# Patient Record
Sex: Male | Born: 1975 | Race: Black or African American | Hispanic: No | Marital: Single | Smoking: Current every day smoker
Health system: Southern US, Community
[De-identification: ages and names within clinical notes are randomized; demographics above are authoritative.]

## PROBLEM LIST (undated history)

## (undated) DIAGNOSIS — N419 Inflammatory disease of prostate, unspecified: Secondary | ICD-10-CM

## (undated) DIAGNOSIS — R7303 Prediabetes: Secondary | ICD-10-CM

## (undated) DIAGNOSIS — R200 Anesthesia of skin: Secondary | ICD-10-CM

## (undated) DIAGNOSIS — H547 Unspecified visual loss: Secondary | ICD-10-CM

## (undated) DIAGNOSIS — K219 Gastro-esophageal reflux disease without esophagitis: Secondary | ICD-10-CM

## (undated) HISTORY — DX: Gastro-esophageal reflux disease without esophagitis: K21.9

## (undated) HISTORY — DX: Inflammatory disease of prostate, unspecified: N41.9

## (undated) HISTORY — DX: Unspecified visual loss: H54.7

## (undated) HISTORY — DX: Anesthesia of skin: R20.0

---

## 2004-01-06 ENCOUNTER — Emergency Department (HOSPITAL_COMMUNITY): Admission: EM | Admit: 2004-01-06 | Discharge: 2004-01-06 | Payer: Self-pay | Admitting: Emergency Medicine

## 2015-02-04 ENCOUNTER — Emergency Department (HOSPITAL_COMMUNITY)
Admission: EM | Admit: 2015-02-04 | Discharge: 2015-02-04 | Disposition: A | Discharge status: Home / Self Care | Payer: Self-pay | Attending: Emergency Medicine | Admitting: Emergency Medicine

## 2015-02-04 ENCOUNTER — Encounter (HOSPITAL_COMMUNITY): Payer: Self-pay | Admitting: Cardiology

## 2015-02-04 DIAGNOSIS — H109 Unspecified conjunctivitis: Secondary | ICD-10-CM | POA: Insufficient documentation

## 2015-02-04 DIAGNOSIS — M79643 Pain in unspecified hand: Secondary | ICD-10-CM | POA: Insufficient documentation

## 2015-02-04 DIAGNOSIS — N390 Urinary tract infection, site not specified: Secondary | ICD-10-CM | POA: Insufficient documentation

## 2015-02-04 DIAGNOSIS — F17200 Nicotine dependence, unspecified, uncomplicated: Secondary | ICD-10-CM | POA: Insufficient documentation

## 2015-02-04 LAB — URINALYSIS, ROUTINE W REFLEX MICROSCOPIC
BILIRUBIN URINE: NEGATIVE
Glucose, UA: NEGATIVE mg/dL
HGB URINE DIPSTICK: NEGATIVE
Ketones, ur: NEGATIVE mg/dL
Nitrite: NEGATIVE
Protein, ur: NEGATIVE mg/dL
Specific Gravity, Urine: 1.022 (ref 1.005–1.030)
pH: 5.5 (ref 5.0–8.0)

## 2015-02-04 LAB — COMPREHENSIVE METABOLIC PANEL
ALT: 30 U/L (ref 17–63)
AST: 26 U/L (ref 15–41)
Albumin: 4.3 g/dL (ref 3.5–5.0)
Alkaline Phosphatase: 51 U/L (ref 38–126)
Anion gap: 10 (ref 5–15)
BUN: 11 mg/dL (ref 6–20)
CALCIUM: 9.9 mg/dL (ref 8.9–10.3)
CO2: 24 mmol/L (ref 22–32)
CREATININE: 1.2 mg/dL (ref 0.61–1.24)
Chloride: 106 mmol/L (ref 101–111)
GFR calc Af Amer: >60 mL/min (ref >60)
GFR calc non Af Amer: >60 mL/min (ref >60)
GLUCOSE: 118 mg/dL — AB (ref 65–99)
Potassium: 4.4 mmol/L (ref 3.5–5.1)
SODIUM: 140 mmol/L (ref 135–145)
Total Bilirubin: 0.7 mg/dL (ref 0.3–1.2)
Total Protein: 7.3 g/dL (ref 6.5–8.1)

## 2015-02-04 LAB — CBC
HCT: 47.6 % (ref 39.0–52.0)
HEMOGLOBIN: 15.8 g/dL (ref 13.0–17.0)
MCH: 31.6 pg (ref 26.0–34.0)
MCHC: 33.2 g/dL (ref 30.0–36.0)
MCV: 95.2 fL (ref 78.0–100.0)
PLATELETS: 216 10*3/uL (ref 150–400)
RBC: 5 MIL/uL (ref 4.22–5.81)
RDW: 12.7 % (ref 11.5–15.5)
WBC: 7.4 10*3/uL (ref 4.0–10.5)

## 2015-02-04 LAB — URINE MICROSCOPIC-ADD ON

## 2015-02-04 LAB — LIPASE, BLOOD: Lipase: 26 U/L (ref 11–51)

## 2015-02-04 MED ORDER — TOBRAMYCIN 0.3 % OP SOLN: 2.0000 [drp] | Freq: Four times a day (QID) | OPHTHALMIC | Status: DC

## 2015-02-04 MED ORDER — CIPROFLOXACIN HCL 500 MG PO TABS: 500.0000 mg | ORAL_TABLET | Freq: Two times a day (BID) | ORAL | Status: DC

## 2015-02-04 MED ORDER — CIPROFLOXACIN HCL 500 MG PO TABS
750.0000 mg | ORAL_TABLET | Freq: Once | ORAL | Status: AC
  Administered 2015-02-04: 750 mg via ORAL
  Filled 2015-02-04: qty 2
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
## 2015-02-04 NOTE — Discharge Instructions (Signed)
Prescription for antibiotic for your urine and eyedrops. Tylenol for pain. Increase fluids. Your sugar or glucose was minimally elevated.

## 2015-02-04 NOTE — ED Notes (Signed)
Pt is in stable condition upon d/c and ambulates from ED. 
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
## 2015-02-05 LAB — URINE CULTURE
Culture: NO GROWTH
Special Requests: NORMAL

## 2015-02-08 NOTE — ED Provider Notes (Signed)
CSN: [id number]     Arrival date & time 02/04/15  1003 History   First MD Initiated Contact with Patient 02/04/15 1420     Chief Complaint  Patient presents with  . Emesis  . Eye Pain  . Hand Pain     (Consider location/radiation/quality/duration/timing/severity/associated sxs/prior Treatment) HPI..... Patient complains of general malaise, dry red eyes, minimal photophobia, nausea. He is normally healthy. No fever, sweats, chills. Severity symptoms is minimal to moderate. No frank dysuria.  History reviewed. No pertinent past medical history. History reviewed. No pertinent past surgical history. History reviewed. No pertinent family history. Social History  Substance Use Topics  . Smoking status: Current Every Day Smoker  . Smokeless tobacco: None  . Alcohol Use: No    Review of Systems  All other systems reviewed and are negative.     Allergies  Vicodin  Home Medications   Prior to Admission medications   Medication Sig Start Date End Date Taking? Authorizing Provider  acetaminophen (TYLENOL) 500 MG tablet Take 500 mg by mouth every 6 (six) hours as needed for mild pain.   Yes Historical Provider, MD  pseudoephedrine (SUDAFED) 30 MG tablet Take 30 mg by mouth every 4 (four) hours as needed for congestion.   Yes Historical Provider, MD  ciprofloxacin (CIPRO) 500 MG tablet Take 1 tablet (500 mg total) by mouth 2 (two) times daily. 02/04/15   Donnetta Hutching, MD  tobramycin (TOBREX) 0.3 % ophthalmic solution Place 2 drops into both eyes 4 (four) times daily. 02/04/15   Donnetta Hutching, MD   BP 137/91 mmHg  Pulse 60  Temp(Src) 97.7 F (36.5 C) (Oral)  Resp 18  Ht 6' (1.829 m)  Wt 240 lb (108.863 kg)  BMI 32.54 kg/m2  SpO2 100% Physical Exam  Constitutional: He is oriented to person, place, and time. He appears well-developed and well-nourished.  Patient appears well-hydrated and in no acute distress. No neurological deficits were noted.  HENT:  Head: Normocephalic and  atraumatic.  Eyes: Conjunctivae and EOM are normal. Pupils are equal, round, and reactive to light.  Conjunctiva are injected bilaterally  Neck: Normal range of motion. Neck supple.  Cardiovascular: Normal rate and regular rhythm.   Pulmonary/Chest: Effort normal and breath sounds normal.  Abdominal: Soft. Bowel sounds are normal.  Genitourinary:  No flank tenderness  Musculoskeletal: Normal range of motion.  Neurological: He is alert and oriented to person, place, and time.  Skin: Skin is warm and dry.  Psychiatric: He has a normal mood and affect. His behavior is normal.  Nursing note and vitals reviewed.   ED Course  Procedures (including critical care time) Labs Review Labs Reviewed  COMPREHENSIVE METABOLIC PANEL - Abnormal; Notable for the following:    Glucose, Bld 118 (*)    All other components within normal limits  URINALYSIS, ROUTINE W REFLEX MICROSCOPIC (NOT AT [facility]) - Abnormal; Notable for the following:    Color, Urine AMBER (*)    Leukocytes, UA SMALL (*)    All other components within normal limits  URINE MICROSCOPIC-ADD ON - Abnormal; Notable for the following:    Squamous Epithelial / LPF 0-5 (*)    Bacteria, UA RARE (*)    All other components within normal limits  URINE CULTURE  LIPASE, BLOOD  CBC    Imaging Review No results found. I have personally reviewed and evaluated these images and lab results as part of my medical decision-making.   EKG Interpretation None      MDM  Final diagnoses:  UTI (lower urinary tract infection)  Bilateral conjunctivitis    Urinalysis shows minor evidence of infection. Will culture urine and start Cipro. Rx tobramycin ophthalmic drops for eyes. Normal vital signs.    Donnetta HutchingBrian Melonee Gerstel, MD 02/08/15 1640

## 2015-02-16 ENCOUNTER — Ambulatory Visit: Payer: Self-pay

## 2015-03-03 ENCOUNTER — Encounter (HOSPITAL_COMMUNITY): Payer: Self-pay | Admitting: Emergency Medicine

## 2015-03-03 ENCOUNTER — Emergency Department (HOSPITAL_COMMUNITY)
Admission: EM | Admit: 2015-03-03 | Discharge: 2015-03-04 | Disposition: A | Discharge status: Home / Self Care | Payer: Self-pay | Attending: Emergency Medicine | Admitting: Emergency Medicine

## 2015-03-03 ENCOUNTER — Emergency Department (HOSPITAL_COMMUNITY): Payer: Self-pay

## 2015-03-03 DIAGNOSIS — R109 Unspecified abdominal pain: Secondary | ICD-10-CM

## 2015-03-03 DIAGNOSIS — R1030 Lower abdominal pain, unspecified: Secondary | ICD-10-CM

## 2015-03-03 DIAGNOSIS — F17200 Nicotine dependence, unspecified, uncomplicated: Secondary | ICD-10-CM | POA: Insufficient documentation

## 2015-03-03 DIAGNOSIS — R10A0 Flank pain, unspecified side: Secondary | ICD-10-CM

## 2015-03-03 DIAGNOSIS — R300 Dysuria: Secondary | ICD-10-CM

## 2015-03-03 LAB — CBC
HCT: 47.2 % (ref 39.0–52.0)
Hemoglobin: 16.5 g/dL (ref 13.0–17.0)
MCH: 32.8 pg (ref 26.0–34.0)
MCHC: 35 g/dL (ref 30.0–36.0)
MCV: 93.8 fL (ref 78.0–100.0)
Platelets: 216 10*3/uL (ref 150–400)
RBC: 5.03 MIL/uL (ref 4.22–5.81)
RDW: 12.5 % (ref 11.5–15.5)
WBC: 9 10*3/uL (ref 4.0–10.5)

## 2015-03-03 LAB — URINALYSIS, ROUTINE W REFLEX MICROSCOPIC
BILIRUBIN URINE: NEGATIVE
Glucose, UA: NEGATIVE mg/dL
Hgb urine dipstick: NEGATIVE
Ketones, ur: NEGATIVE mg/dL
Leukocytes, UA: NEGATIVE
NITRITE: NEGATIVE
Protein, ur: NEGATIVE mg/dL
Specific Gravity, Urine: 1.017 (ref 1.005–1.030)
pH: 6.5 (ref 5.0–8.0)

## 2015-03-03 LAB — COMPREHENSIVE METABOLIC PANEL
ALK PHOS: 52 U/L (ref 38–126)
ALT: 14 U/L — ABNORMAL LOW (ref 17–63)
AST: 22 U/L (ref 15–41)
Albumin: 4.6 g/dL (ref 3.5–5.0)
Anion gap: 9 (ref 5–15)
BILIRUBIN TOTAL: 0.8 mg/dL (ref 0.3–1.2)
BUN: 12 mg/dL (ref 6–20)
CO2: 25 mmol/L (ref 22–32)
Calcium: 9.7 mg/dL (ref 8.9–10.3)
Chloride: 103 mmol/L (ref 101–111)
Creatinine, Ser: 1.51 mg/dL — ABNORMAL HIGH (ref 0.61–1.24)
GFR calc Af Amer: >60 mL/min (ref >60)
GFR calc non Af Amer: 57 mL/min — ABNORMAL LOW (ref >60)
Glucose, Bld: 104 mg/dL — ABNORMAL HIGH (ref 65–99)
Potassium: 4.1 mmol/L (ref 3.5–5.1)
Sodium: 137 mmol/L (ref 135–145)
Total Protein: 7.5 g/dL (ref 6.5–8.1)

## 2015-03-03 LAB — LIPASE, BLOOD: Lipase: 28 U/L (ref 11–51)

## 2015-03-03 NOTE — ED Notes (Signed)
Patient states that he was dx with UTI last week but is now having more L sided flank pain and lower pelvic pain. Alert and oriented.

## 2015-03-03 NOTE — ED Notes (Signed)
PA at bedside.

## 2015-03-03 NOTE — ED Notes (Signed)
Pt able to tolerate fluids with no problem

## 2015-03-03 NOTE — ED Notes (Signed)
Pt given ginger ale.

## 2015-03-04 MED ORDER — DOXYCYCLINE HYCLATE 100 MG PO CAPS: 100.0000 mg | ORAL_CAPSULE | Freq: Two times a day (BID) | ORAL | Status: DC

## 2015-03-04 MED ORDER — CEFTRIAXONE SODIUM 250 MG IJ SOLR
250.0000 mg | Freq: Once | INTRAMUSCULAR | Status: AC
  Administered 2015-03-04: 250 mg via INTRAMUSCULAR
  Filled 2015-03-04: qty 250

## 2015-03-04 MED ORDER — AZITHROMYCIN 250 MG PO TABS
1000.0000 mg | ORAL_TABLET | Freq: Once | ORAL | Status: AC
  Administered 2015-03-04: 1000 mg via ORAL
  Filled 2015-03-04: qty 4

## 2015-03-04 NOTE — Discharge Instructions (Signed)
Flank Pain °Flank pain refers to pain that is located on the side of the body between the upper abdomen and the back. The pain may occur over a short period of time (acute) or may be long-term or reoccurring (chronic). It may be mild or severe. Flank pain can be caused by many things. °CAUSES  °Some of the more common causes of flank pain include: °· Muscle strains.   °· Muscle spasms.   °· A disease of your spine (vertebral disk disease).   °· A lung infection (pneumonia).   °· Fluid around your lungs (pulmonary edema).   °· A kidney infection.   °· Kidney stones.   °· A very painful skin rash caused by the chickenpox virus (shingles).   °· Gallbladder disease.   °HOME CARE INSTRUCTIONS  °Home care will depend on the cause of your pain. In general, °· Rest as directed by your caregiver. °· Drink enough fluids to keep your urine clear or pale yellow. °· Only take over-the-counter or prescription medicines as directed by your caregiver. Some medicines may help relieve the pain. °· Tell your caregiver about any changes in your pain. °· Follow up with your caregiver as directed. °SEEK IMMEDIATE MEDICAL CARE IF:  °· Your pain is not controlled with medicine.   °· You have new or worsening symptoms. °· Your pain increases.   °· You have abdominal pain.   °· You have shortness of breath.   °· You have persistent nausea or vomiting.   °· You have swelling in your abdomen.   °· You feel faint or pass out.   °· You have blood in your urine. °· You have a fever or persistent symptoms for more than 2-3 days. °· You have a fever and your symptoms suddenly get worse. °MAKE SURE YOU:  °· Understand these instructions. °· Will watch your condition. °· Will get help right away if you are not doing well or get worse. °  °This information is not intended to replace advice given to you by your health care provider. Make sure you discuss any questions you have with your health care provider. °  °Document Released: 03/16/2005 Document  Revised: 10/18/2011 Document Reviewed: 09/07/2011 °Elsevier Interactive Patient Education ©2016 [employer] ° °

## 2015-03-04 NOTE — ED Provider Notes (Signed)
CSN: [id number]     Arrival date & time 03/03/15  1917 History   First MD Initiated Contact with Patient 03/03/15 2159     Chief Complaint  Patient presents with  . Flank Pain     (Consider location/radiation/quality/duration/timing/severity/associated sxs/prior Treatment) HPI Comments: Patient presents to the emergency department with chief complaint of left-sided flank pain, suprapubic pain, and some dysuria. Patient states that he was recently treated for UTI, and his symptoms largely improved, but then they started to return. He denies any fevers chills. Denies any penile discharge. There are no aggravating or alleviating factors. Denies any new sexual contacts.  Patient also complains of some bilateral eye redness which is mild. He denies any vision changes. He states that this is been present for several weeks. He denies any pain.  The history is provided by the patient. No language interpreter was used.    History reviewed. No pertinent past medical history. History reviewed. No pertinent past surgical history. History reviewed. No pertinent family history. Social History  Substance Use Topics  . Smoking status: Current Every Day Smoker  . Smokeless tobacco: None  . Alcohol Use: No    Review of Systems  All other systems reviewed and are negative.     Allergies  Vicodin  Home Medications   Prior to Admission medications   Medication Sig Start Date End Date Taking? Authorizing Provider  acetaminophen (TYLENOL) 325 MG tablet Take 650 mg by mouth every 6 (six) hours as needed for mild pain or moderate pain.   Yes Historical Provider, MD  ciprofloxacin (CIPRO) 500 MG tablet Take 1 tablet (500 mg total) by mouth 2 (two) times daily. Patient not taking: Reported on 03/03/2015 02/04/15   Donnetta Hutching, MD  doxycycline (VIBRAMYCIN) 100 MG capsule Take 1 capsule (100 mg total) by mouth 2 (two) times daily. 03/04/15   Roxy Horseman, PA-C  tobramycin (TOBREX) 0.3 % ophthalmic  solution Place 2 drops into both eyes 4 (four) times daily. Patient not taking: Reported on 03/03/2015 02/04/15   Donnetta Hutching, MD   BP 137/70 mmHg  Pulse 63  Temp(Src) 98.2 F (36.8 C) (Oral)  Resp 16  SpO2 100% Physical Exam  Constitutional: He is oriented to person, place, and time. He appears well-developed and well-nourished.  HENT:  Head: Normocephalic and atraumatic.  Eyes: Conjunctivae and EOM are normal. Pupils are equal, round, and reactive to light. Right eye exhibits no discharge. Left eye exhibits no discharge. No scleral icterus.  Neck: Normal range of motion. Neck supple. No JVD present.  Cardiovascular: Normal rate, regular rhythm and normal heart sounds.  Exam reveals no gallop and no friction rub.   No murmur heard. Pulmonary/Chest: Effort normal and breath sounds normal. No respiratory distress. He has no wheezes. He has no rales. He exhibits no tenderness.  Abdominal: Soft. He exhibits no distension and no mass. There is no tenderness. There is no rebound and no guarding.  No focal abdominal tenderness, no RLQ tenderness or pain at McBurney's point, no RUQ tenderness or Murphy's sign, no left-sided abdominal tenderness, no fluid wave, or signs of peritonitis   Genitourinary:  Normal scrotum, normal testes, normal penis, no discharge  Musculoskeletal: Normal range of motion. He exhibits no edema or tenderness.  Neurological: He is alert and oriented to person, place, and time.  Skin: Skin is warm and dry.  Psychiatric: He has a normal mood and affect. His behavior is normal. Judgment and thought content normal.  Nursing note and vitals reviewed.  ED Course  Procedures (including critical care time) Labs Review Labs Reviewed  COMPREHENSIVE METABOLIC PANEL - Abnormal; Notable for the following:    Glucose, Bld 104 (*)    Creatinine, Ser 1.51 (*)    ALT 14 (*)    GFR calc non Af Amer 57 (*)    All other components within normal limits  LIPASE, BLOOD  CBC   URINALYSIS, ROUTINE W REFLEX MICROSCOPIC (NOT AT [facility])  GC/CHLAMYDIA PROBE AMP ([place]) NOT AT [facility]    Imaging Review Ct Abdomen Pelvis Wo Contrast  03/03/2015  CLINICAL DATA:  Left-sided flank pain.  History of recent UTI. EXAM: CT ABDOMEN AND PELVIS WITHOUT CONTRAST TECHNIQUE: Multidetector CT imaging of the abdomen and pelvis was performed following the standard protocol without IV contrast. COMPARISON:  CT of the abdomen pelvis 01/06/2004 FINDINGS: Lower chest:  No acute findings. Hepatobiliary: No mass visualized on this un-enhanced exam. Pancreas: No mass or inflammatory process identified on this un-enhanced exam. Spleen: Within normal limits in size. Adrenals/Urinary Tract: No evidence of urolithiasis or hydronephrosis. No definite mass visualized on this un-enhanced exam. Stomach/Bowel: No evidence of obstruction, inflammatory process, or abnormal fluid collections. Vascular/Lymphatic: No pathologically enlarged lymph nodes. No evidence of abdominal aortic aneurysm. Minimal atherosclerotic disease of the distal aorta. Reproductive: No mass or other significant abnormality. Other: None. Musculoskeletal:  No suspicious bone lesions identified. IMPRESSION: No evidence of obstructive uropathy. Normal nonenhanced appearance of the abdomen and pelvis. Electronically Signed   By: Ted Mcalpine M.D.   On: 03/03/2015 22:55   I have personally reviewed and evaluated these images and lab results as part of my medical decision-making.   EKG Interpretation None      MDM   Final diagnoses:  Flank pain  Suprapubic pain, unspecified laterality  Dysuria    Patient with left flank pain, some suprapubic abdominal pain, and dysuria. Recently treated for UTI, but symptoms returned. I reviewed the urine culture from the past visit, it did not grow out anything. Patient concerned about kidney stone. Will check CT. CT is negative for kidney stone. Negative for any other abnormality. I'm less  suspicious of urinary tract infection, and more suspicious of STI. I will treat the patient with Rocephin and azithromycin. Will also consider adding doxycycline. I discussed patient with Dr. Adriana Simas, who agrees with the plan. Patient advised to follow-up with his primary care provider in 2 weeks. He will need to have his creatinine rechecked, as this is somewhat elevated today. I have advised that he drink more water. Patient understands agrees with this plan. I also recommended that the patient see an ophthalmologist for his eye pain. Patient has been given verbal and written instructions. He is stable and ready for discharge.    Roxy Horseman, PA-C 03/04/15 0103  Cy Blamer, MD 03/04/15 [phone]

## 2015-03-05 LAB — GC/CHLAMYDIA PROBE AMP (~~LOC~~) NOT AT ARMC
CHLAMYDIA, DNA PROBE: NEGATIVE
Neisseria Gonorrhea: NEGATIVE

## 2015-05-17 DIAGNOSIS — R109 Unspecified abdominal pain: Secondary | ICD-10-CM | POA: Diagnosis not present

## 2015-05-17 DIAGNOSIS — N411 Chronic prostatitis: Secondary | ICD-10-CM | POA: Diagnosis not present

## 2015-06-22 DIAGNOSIS — Z23 Encounter for immunization: Secondary | ICD-10-CM | POA: Diagnosis not present

## 2015-06-22 DIAGNOSIS — Z0000 Encounter for general adult medical examination without abnormal findings: Secondary | ICD-10-CM | POA: Diagnosis not present

## 2015-08-19 DIAGNOSIS — R300 Dysuria: Secondary | ICD-10-CM | POA: Diagnosis not present

## 2015-08-19 DIAGNOSIS — H1013 Acute atopic conjunctivitis, bilateral: Secondary | ICD-10-CM | POA: Diagnosis not present

## 2015-10-26 DIAGNOSIS — K219 Gastro-esophageal reflux disease without esophagitis: Secondary | ICD-10-CM | POA: Diagnosis not present

## 2015-10-26 DIAGNOSIS — S62304A Unspecified fracture of fourth metacarpal bone, right hand, initial encounter for closed fracture: Secondary | ICD-10-CM | POA: Diagnosis not present

## 2015-10-26 DIAGNOSIS — M25441 Effusion, right hand: Secondary | ICD-10-CM | POA: Diagnosis not present

## 2015-11-10 DIAGNOSIS — S62304D Unspecified fracture of fourth metacarpal bone, right hand, subsequent encounter for fracture with routine healing: Secondary | ICD-10-CM | POA: Diagnosis not present

## 2015-11-23 DIAGNOSIS — S62304D Unspecified fracture of fourth metacarpal bone, right hand, subsequent encounter for fracture with routine healing: Secondary | ICD-10-CM | POA: Diagnosis not present

## 2015-11-24 DIAGNOSIS — M25342 Other instability, left hand: Secondary | ICD-10-CM | POA: Diagnosis not present

## 2015-11-24 DIAGNOSIS — M25542 Pain in joints of left hand: Secondary | ICD-10-CM | POA: Diagnosis not present

## 2015-11-24 DIAGNOSIS — M6281 Muscle weakness (generalized): Secondary | ICD-10-CM | POA: Diagnosis not present

## 2015-11-24 DIAGNOSIS — S62304A Unspecified fracture of fourth metacarpal bone, right hand, initial encounter for closed fracture: Secondary | ICD-10-CM | POA: Diagnosis not present

## 2016-04-26 DIAGNOSIS — N342 Other urethritis: Secondary | ICD-10-CM | POA: Diagnosis not present

## 2016-04-26 DIAGNOSIS — J029 Acute pharyngitis, unspecified: Secondary | ICD-10-CM | POA: Diagnosis not present

## 2016-05-16 DIAGNOSIS — J301 Allergic rhinitis due to pollen: Secondary | ICD-10-CM | POA: Diagnosis not present

## 2016-05-16 DIAGNOSIS — R0981 Nasal congestion: Secondary | ICD-10-CM | POA: Diagnosis not present

## 2016-05-16 DIAGNOSIS — J029 Acute pharyngitis, unspecified: Secondary | ICD-10-CM | POA: Diagnosis not present

## 2016-06-12 DIAGNOSIS — J0100 Acute maxillary sinusitis, unspecified: Secondary | ICD-10-CM | POA: Diagnosis not present

## 2016-11-21 ENCOUNTER — Other Ambulatory Visit: Payer: Self-pay | Admitting: Family Medicine

## 2016-11-21 DIAGNOSIS — E7800 Pure hypercholesterolemia, unspecified: Secondary | ICD-10-CM | POA: Diagnosis not present

## 2016-11-21 DIAGNOSIS — R109 Unspecified abdominal pain: Secondary | ICD-10-CM | POA: Diagnosis not present

## 2016-11-21 DIAGNOSIS — R7301 Impaired fasting glucose: Secondary | ICD-10-CM | POA: Diagnosis not present

## 2016-11-21 DIAGNOSIS — Z113 Encounter for screening for infections with a predominantly sexual mode of transmission: Secondary | ICD-10-CM | POA: Diagnosis not present

## 2016-11-23 ENCOUNTER — Ambulatory Visit
Admission: RE | Admit: 2016-11-23 | Discharge: 2016-11-23 | Disposition: A | Discharge status: Home / Self Care | Payer: BLUE CROSS/BLUE SHIELD | Source: Ambulatory Visit | Attending: Family Medicine | Admitting: Family Medicine

## 2016-11-23 DIAGNOSIS — R109 Unspecified abdominal pain: Secondary | ICD-10-CM

## 2016-11-23 DIAGNOSIS — K5730 Diverticulosis of large intestine without perforation or abscess without bleeding: Secondary | ICD-10-CM | POA: Diagnosis not present

## 2016-11-30 ENCOUNTER — Encounter: Payer: Self-pay | Admitting: Registered"

## 2016-11-30 ENCOUNTER — Encounter: Payer: BLUE CROSS/BLUE SHIELD | Attending: Family Medicine | Admitting: Registered"

## 2016-11-30 DIAGNOSIS — R7303 Prediabetes: Secondary | ICD-10-CM | POA: Diagnosis not present

## 2016-11-30 DIAGNOSIS — Z713 Dietary counseling and surveillance: Secondary | ICD-10-CM | POA: Diagnosis not present

## 2016-11-30 NOTE — Patient Instructions (Signed)
Plan:  Aim to avoid skipping meals Eat balanced meals and snacks and include protein Consider reading food labels for Total Carbohydrate Consider increasing your activity level as tolerated, Your goal is 6 days 30 min. Aim to get 7-8 hrs sleep at night.

## 2016-11-30 NOTE — Progress Notes (Signed)
Medical Nutrition Therapy:  Appt start time: 0945 end time:  1040.   Assessment:  Primary concerns today: Pt states he wants to learn about healthy eating to avoid diabetes. Per referral labs 11/21/16, A1c is 6.4%. Pt states he eats better when he is spending time with his fiancee.  Pt states he used to play basketball and even considered trying to go pro, but decided against that path due to family responsibilities and now does not want to play any more. Patient reports over the last 2-3 years he has been doing bare minimum physical activity. 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   Pt state he tries to take vitamins sometimes to make up for skipping meals. Pt states he has seen weight gain when he becomes inactive and snacks on things like salt & vinegar chips.  Pt states he would like to know if he is making progress in controlling blood sugar and is interested in getting meter and checking sugar.  Sleep: 6 hrs.   Preferred Learning Style:   No preference indicated   Learning Readiness:   Ready  MEDICATIONS: reviewed   DIETARY INTAKE:  Usual eating pattern includes 2 meals and 2 snacks per day.  24-hr recall:  B ( AM): none OR weekends coco, apple OR nabs  Snk ( AM): none OR candy or chocolate binges for 2 days  L ( PM): taco salad, or sandwich (may skip if eats a good breakfast) Snk ( PM): trail mix D (6 PM): "I eat healthy dinner" salmon, potato, green veg OR eating out will get salad OR chicken, pasta and salad Snk ( PM): fruit or trail mix Beverages: cocoa, water, coke zero, arnold palmer when go out to eat, ocassional alcohol at home- none in last 2 weeks,   Usual physical activity: ADLs  Estimated energy needs: 2100 calories 235 g carbohydrates 131 g protein 70 g fat  Progress Towards Goal(s):  In progress.   Nutritional Diagnosis:  NI-5.8.4 Inconsistent carbohydrate intake As related to skipping meals and then  snacking on candy.  As evidenced by dietary recalla and patient reported eating patterns..    Intervention:  Nutrition education for managing blood glucose with diet and lifestyle changes. Described diabetes. Defined the role of glucose and insulin.  Identified type of diabetes and pathophysiology.  Described the relationship between diabetes and cardiovascular risk.  Described the role of different macronutrients on glucose.  Explained how carbohydrates affect blood glucose.  Stated what foods contain the most carbohydrates. Discussed purpose of SMBG.  Plan:  Aim to avoid skipping meals Eat balanced meals and snacks and include protein Consider reading food labels for Total Carbohydrate Consider increasing your activity level as tolerated, Your goal is 6 days 30 min. Aim to get 7-8 hrs sleep at night.  Teaching Method Utilized:  Visual Auditory  Handouts given during visit include:  My Plate  [id number]  Snack sheet  Carb sheet  Sleep Hygiene  Barriers to learning/adherence to lifestyle change: none  Demonstrated degree of understanding via:  Teach Back   Monitoring/Evaluation:  Dietary intake, exercise, BG log, and body weight in 4 week(s).

## 2017-01-01 ENCOUNTER — Ambulatory Visit: Payer: BLUE CROSS/BLUE SHIELD | Admitting: Registered"

## 2017-02-26 IMAGING — CT CT ABD-PELV W/O CM
2 of 4 series · 17 of 46 positions shown, 19 images · non-contrast
Comparison: CT of the abdomen pelvis 01/06/2004

CLINICAL DATA: Left-sided flank pain.  History of recent UTI.

EXAM:
CT ABDOMEN AND PELVIS WITHOUT CONTRAST
TECHNIQUE: Multidetector CT imaging of the abdomen and pelvis was performed
following the standard protocol without IV contrast.

[Series 2: abd/pel w/o · axial · non-contrast · 0.79mm/px · z∈[+1036,+1451]mm · 14 of 91 slices shown, 16 images]
[im 4/91  soft-tissue]
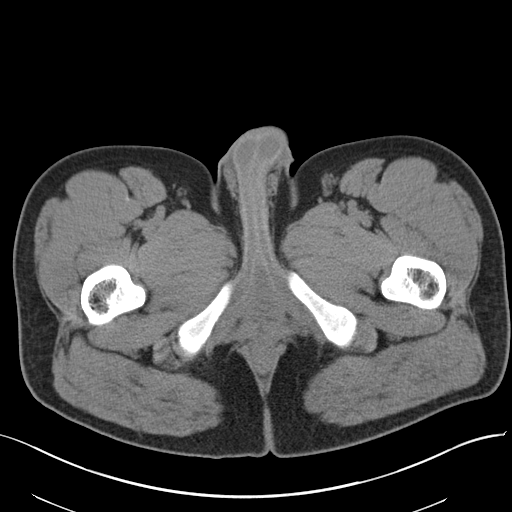
[im 4/91  bone]
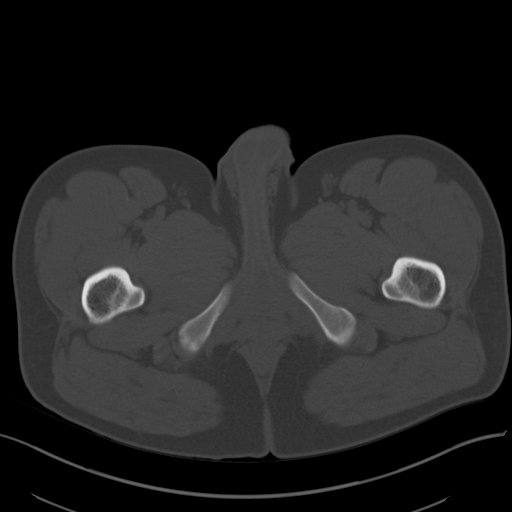
[im 11/91  soft-tissue]
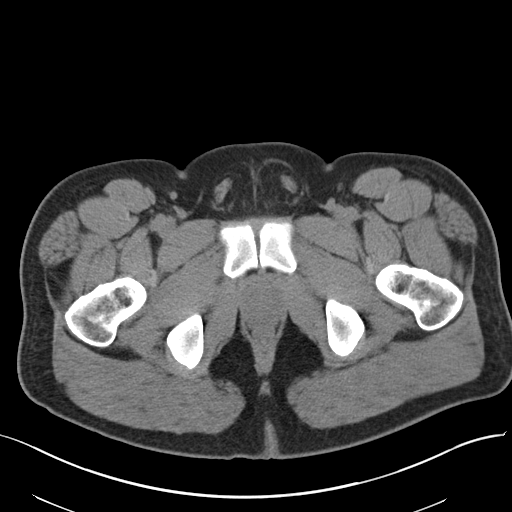
[im 19/91  soft-tissue]
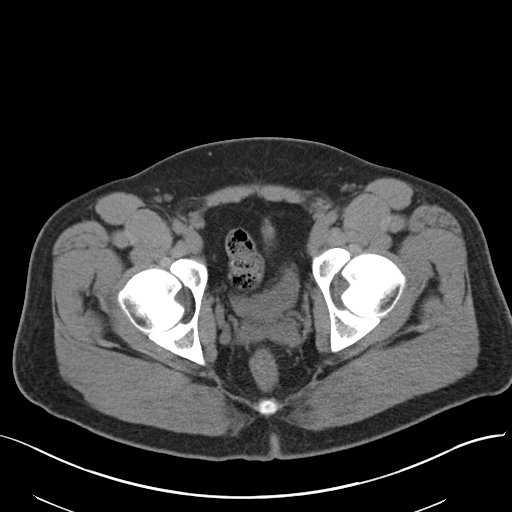
[im 26/91  soft-tissue]
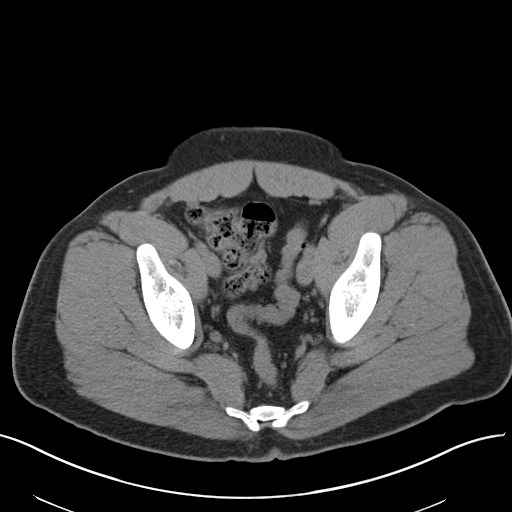
[im 29/91  soft-tissue]
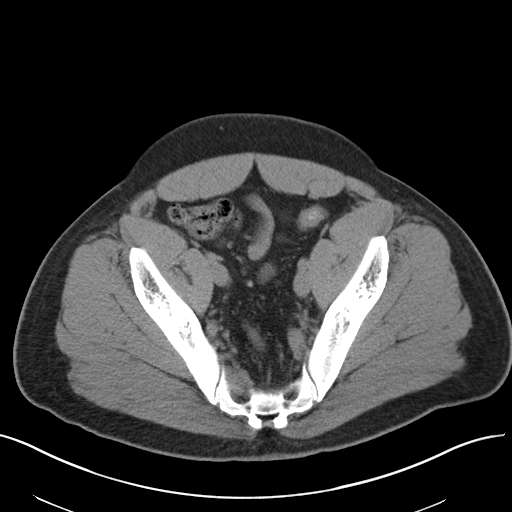
[im 37/91  soft-tissue]
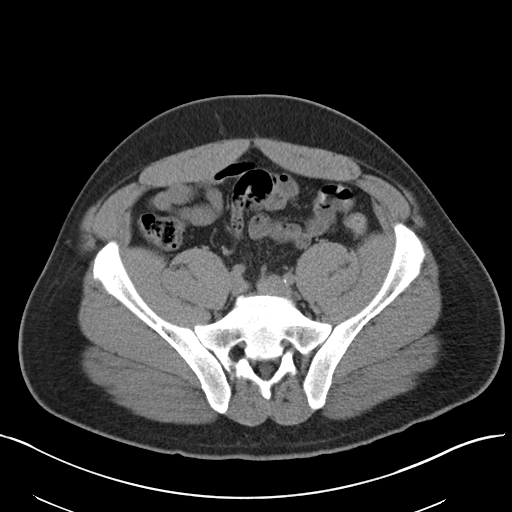
[im 44/91  soft-tissue]
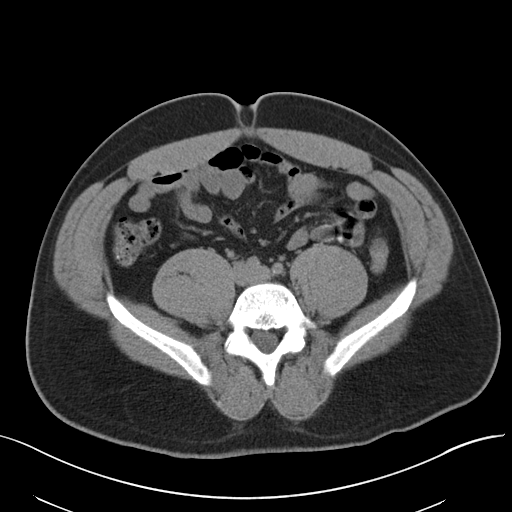
[im 47/91  soft-tissue]
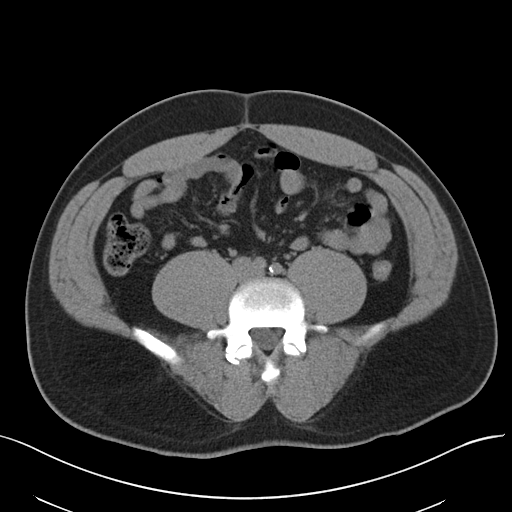
[im 55/91  soft-tissue]
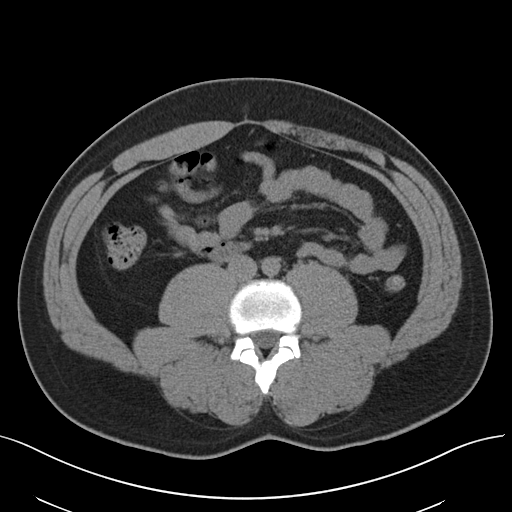
[im 55/91  bone]
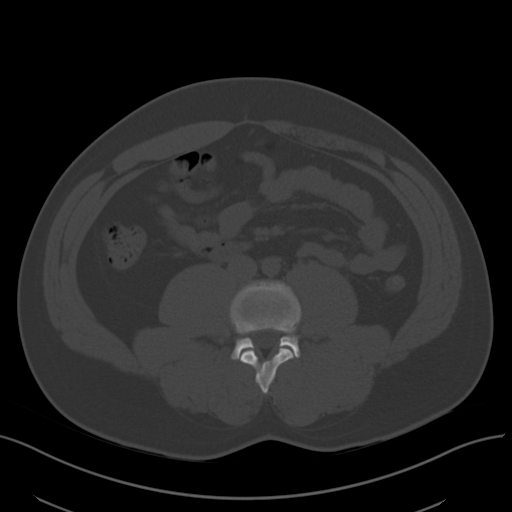
[im 62/91  soft-tissue]
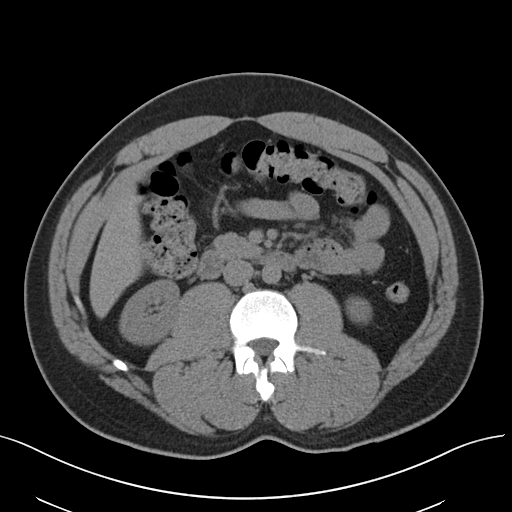
[im 69/91  soft-tissue]
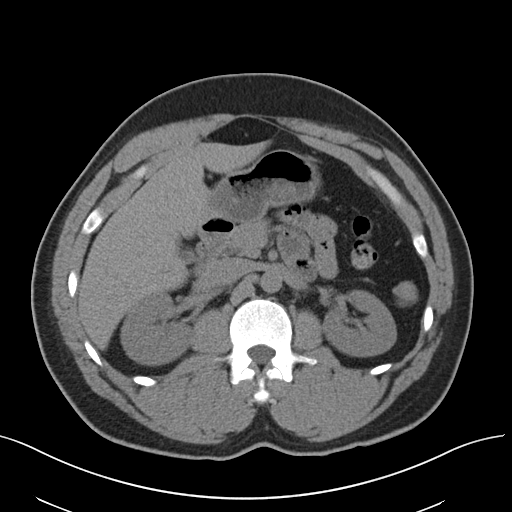
[im 73/91  soft-tissue]
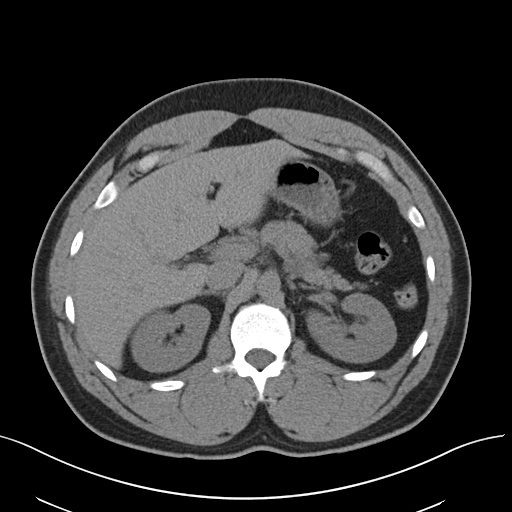
[im 80/91  soft-tissue]
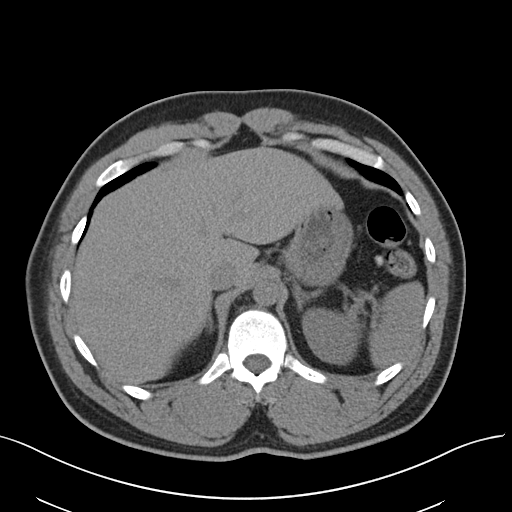
[im 87/91  soft-tissue]
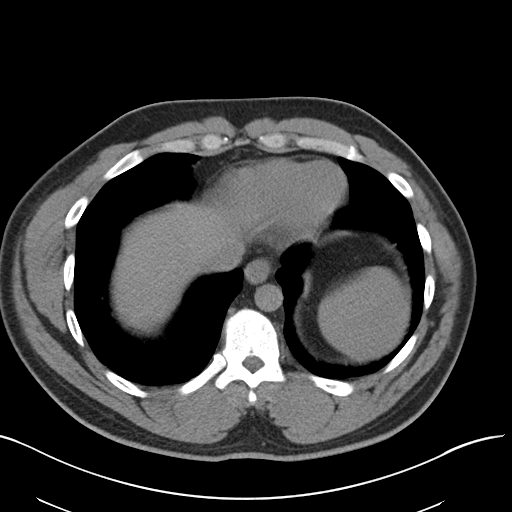

[Series 3: coronal · coronal · 0.74mm/px · 3 of 96 slices shown]
[im 32/96  soft-tissue]
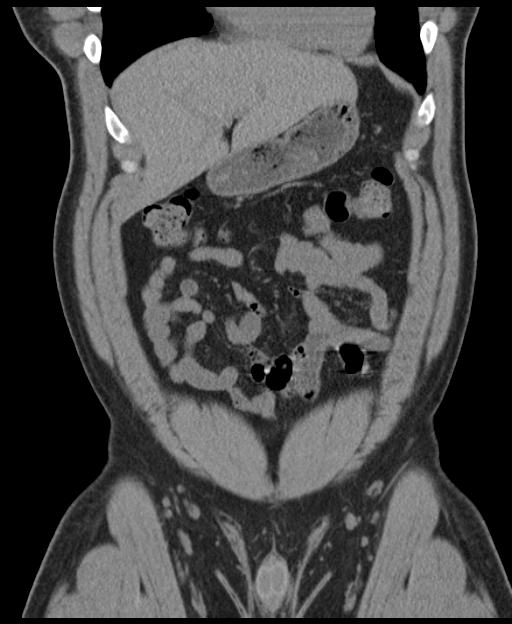
[im 43/96  soft-tissue]
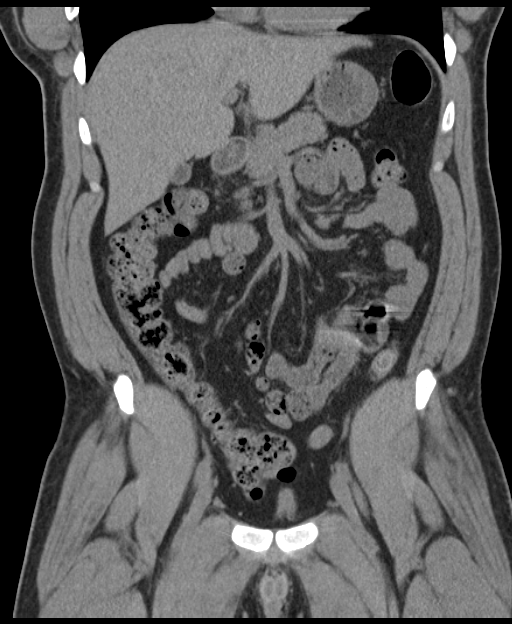
[im 53/96  soft-tissue]
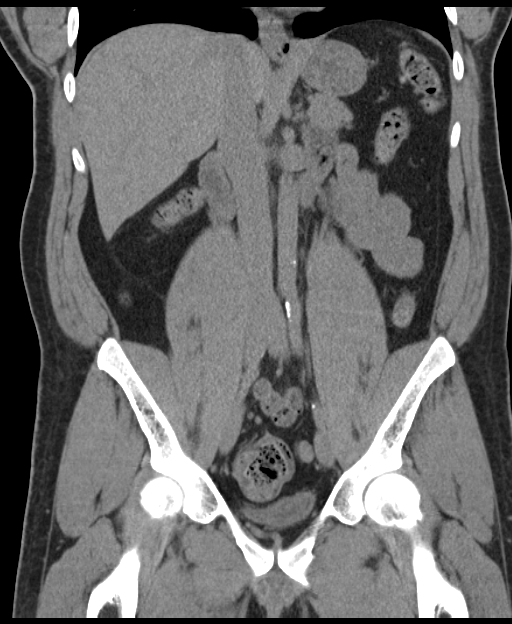

[17 of 46 positions shown; findings below may reference images not displayed]

FINDINGS: Lower chest:  No acute findings.

Hepatobiliary: No mass visualized on this un-enhanced exam.

Pancreas: No mass or inflammatory process identified on this
un-enhanced exam.

Spleen: Within normal limits in size.

Adrenals/Urinary Tract: No evidence of urolithiasis or
hydronephrosis. No definite mass visualized on this un-enhanced
exam.

Stomach/Bowel: No evidence of obstruction, inflammatory process, or
abnormal fluid collections.

Vascular/Lymphatic: No pathologically enlarged lymph nodes. No
evidence of abdominal aortic aneurysm. Minimal atherosclerotic
disease of the distal aorta.

Reproductive: No mass or other significant abnormality.

Other: None.

Musculoskeletal:  No suspicious bone lesions identified.
IMPRESSION: No evidence of obstructive uropathy.

Normal nonenhanced appearance of the abdomen and pelvis.

## 2018-09-17 ENCOUNTER — Ambulatory Visit: Payer: BLUE CROSS/BLUE SHIELD | Admitting: Sports Medicine

## 2018-10-15 ENCOUNTER — Ambulatory Visit: Payer: BLUE CROSS/BLUE SHIELD | Admitting: Podiatry

## 2018-10-15 ENCOUNTER — Other Ambulatory Visit: Payer: Self-pay

## 2018-10-15 ENCOUNTER — Encounter

## 2019-04-08 DIAGNOSIS — N342 Other urethritis: Secondary | ICD-10-CM | POA: Diagnosis not present

## 2019-11-28 DIAGNOSIS — R369 Urethral discharge, unspecified: Secondary | ICD-10-CM | POA: Diagnosis not present

## 2019-12-03 DIAGNOSIS — R42 Dizziness and giddiness: Secondary | ICD-10-CM | POA: Diagnosis not present

## 2019-12-03 DIAGNOSIS — R072 Precordial pain: Secondary | ICD-10-CM | POA: Diagnosis not present

## 2019-12-17 ENCOUNTER — Emergency Department (HOSPITAL_BASED_OUTPATIENT_CLINIC_OR_DEPARTMENT_OTHER)
Admission: EM | Admit: 2019-12-17 | Discharge: 2019-12-17 | Disposition: A | Discharge status: Home / Self Care | Payer: Self-pay | Attending: Emergency Medicine | Admitting: Emergency Medicine

## 2019-12-17 ENCOUNTER — Other Ambulatory Visit: Payer: Self-pay

## 2019-12-17 ENCOUNTER — Encounter (HOSPITAL_BASED_OUTPATIENT_CLINIC_OR_DEPARTMENT_OTHER): Payer: Self-pay

## 2019-12-17 ENCOUNTER — Emergency Department (HOSPITAL_BASED_OUTPATIENT_CLINIC_OR_DEPARTMENT_OTHER): Payer: Self-pay

## 2019-12-17 DIAGNOSIS — R202 Paresthesia of skin: Secondary | ICD-10-CM | POA: Insufficient documentation

## 2019-12-17 DIAGNOSIS — R0789 Other chest pain: Secondary | ICD-10-CM | POA: Insufficient documentation

## 2019-12-17 DIAGNOSIS — F17290 Nicotine dependence, other tobacco product, uncomplicated: Secondary | ICD-10-CM | POA: Insufficient documentation

## 2019-12-17 DIAGNOSIS — R42 Dizziness and giddiness: Secondary | ICD-10-CM | POA: Insufficient documentation

## 2019-12-17 HISTORY — DX: Prediabetes: R73.03

## 2019-12-17 LAB — BASIC METABOLIC PANEL
Anion gap: 11 (ref 5–15)
BUN: 16 mg/dL (ref 6–20)
CO2: 25 mmol/L (ref 22–32)
Calcium: 9.4 mg/dL (ref 8.9–10.3)
Chloride: 104 mmol/L (ref 98–111)
Creatinine, Ser: 1.22 mg/dL (ref 0.61–1.24)
GFR, Estimated: >60 mL/min (ref >60)
Glucose, Bld: 104 mg/dL — ABNORMAL HIGH (ref 70–99)
Potassium: 4.2 mmol/L (ref 3.5–5.1)
Sodium: 140 mmol/L (ref 135–145)

## 2019-12-17 LAB — TROPONIN I (HIGH SENSITIVITY)
Troponin I (High Sensitivity): 5 ng/L (ref <18)
Troponin I (High Sensitivity): 5 ng/L (ref <18)

## 2019-12-17 LAB — CBC
HCT: 44.8 % (ref 39.0–52.0)
Hemoglobin: 15.1 g/dL (ref 13.0–17.0)
MCH: 32.3 pg (ref 26.0–34.0)
MCHC: 33.7 g/dL (ref 30.0–36.0)
MCV: 95.7 fL (ref 80.0–100.0)
Platelets: 298 10*3/uL (ref 150–400)
RBC: 4.68 MIL/uL (ref 4.22–5.81)
RDW: 12.6 % (ref 11.5–15.5)
WBC: 10.8 10*3/uL — ABNORMAL HIGH (ref 4.0–10.5)
nRBC: 0 % (ref 0.0–0.2)

## 2019-12-17 NOTE — ED Triage Notes (Signed)
Pt c/o intermittent CP x 1 month-states he was seen ~2 weeks ago and advised he "had a heart attack"-was referred to cards-appt 11/16-CP today started ~5am-NAD-steady gait

## 2019-12-17 NOTE — ED Notes (Signed)
Pt on monitor and vitals cycling 

## 2019-12-17 NOTE — Discharge Instructions (Signed)
You were seen in the emergency department today evaluated for your chest pain. Your vital signs, blood work, chest x-ray were very reassuring. There was an abnormality on your EKG, for which you should follow-up with cardiology, however there was no evidence of heart attack at this time.   At this time I feel you are safe to go home.  Please keep your appointment for next week with cardiology, as it will be important for them to be able to evaluate you and monitor your symptoms.  Please return to the emergency department immediately if any new or worsening chest pain, difficulty breathing, palpitations, new nausea or vomiting that does not stop, or other new severe symptoms.

## 2019-12-17 NOTE — ED Provider Notes (Addendum)
[facility] EMERGENCY DEPARTMENT Provider Note   CSN: [id number] Arrival date & time: 12/17/19  1231     History Chief Complaint  Patient presents with  . Chest Pain    Eduardo Schroeder is a 44 y.o. male who presents with concern for left chest pain that radiates into his left shoulder since 5:00 this morning. He states he had a similar episode 2 weeks ago, and he presented to an urgent care, who told him that based on his EKG he likely had a heart attack in the 5 days prior to his visit, but they said he did not need to come to the emergency department at that time.  He has been extremely concerned since that time.  Per chart review his troponin and D-dimer were both normal at urgent care on 12/03/2019. I am unable to review his EKG from that day.  He states he has been taking aspirin daily since this urgent care visit.  He endorses associated intermittent lightheadedness, numbness in his left arm x 1 hour this morning.  Numbness has since resolved, but left chest and shoulder pain continues to persist.  He describes this as pulsating intense pain. He denies shortness of breath, cough, dyspnea on exertion.  Denies nausea, vomiting, abdominal pain, diarrhea, congestion, sore throat, runny nose.  He denies blurry vision or double vision, denies syncope. He has been vaccinated x 2 against COVID-19.  I personally reviewed this patient's medical record.  He is a current everyday smoker, history of prediabetes, does not take any medications every day.  He states that he has never had issues with his heart in the past until 2 weeks ago. He states at that time his blood pressure was also elevated, which is not normal for him.  He states that he has a strong family history for coronary artery disease, his mother has had bypass surgery, additionally she has had multiple blood clots. Patient denies history of blood clots, history of cancer, recent surgeries, recent travel, prolonged  immobilization.  HPI  HPI: A 44 year old patient with a history of obesity presents for evaluation of chest pain. Initial onset of pain was more than 6 hours ago. The patient's chest pain is not worse with exertion. The patient's chest pain is middle- or left-sided, is not well-localized, is not described as heaviness/pressure/tightness, is not sharp and does not radiate to the arms/jaw/neck. The patient does not complain of nausea and denies diaphoresis. The patient has smoked in the past 90 days and has a family history of coronary artery disease in a first-degree relative with onset less than age 34. The patient has no history of stroke, has no history of peripheral artery disease, denies any history of treated diabetes, is not hypertensive and has no history of hypercholesterolemia.   Past Medical History:  Diagnosis Date  . Prediabetes     Patient Active Problem List   Diagnosis Date Noted  . Pre-diabetes 11/30/2016    History reviewed. No pertinent surgical history.     No family history on file.  Social History   Tobacco Use  . Smoking status: Current Every Day Smoker    Types: Cigars  . Smokeless tobacco: Never Used  Vaping Use  . Vaping Use: Never used  Substance Use Topics  . Alcohol use: Yes    Comment: occ  . Drug use: No    Home Medications Prior to Admission medications   Medication Sig Start Date End Date Taking? Authorizing Provider  acetaminophen (TYLENOL)  325 MG tablet Take 650 mg by mouth every 6 (six) hours as needed for mild pain or moderate pain.    [provider]  ciprofloxacin (CIPRO) 500 MG tablet Take 1 tablet (500 mg total) by mouth 2 (two) times daily. Patient not taking: Reported on 03/03/2015 02/04/15   Donnetta Hutchingook, Brian, MD  doxycycline (VIBRAMYCIN) 100 MG capsule Take 1 capsule (100 mg total) by mouth 2 (two) times daily. Patient not taking: Reported on 11/30/2016 03/04/15   Roxy HorsemanBrowning, Robert, PA-C  tobramycin (TOBREX) 0.3 % ophthalmic  solution Place 2 drops into both eyes 4 (four) times daily. Patient not taking: Reported on 03/03/2015 02/04/15   Donnetta Hutchingook, Brian, MD    Allergies    Vicodin [hydrocodone-acetaminophen]  Review of Systems   Review of Systems  Constitutional: Negative for activity change, appetite change, chills, diaphoresis, fatigue and fever.  HENT: Negative.   Respiratory: Negative for cough, chest tightness and shortness of breath.   Cardiovascular: Positive for chest pain. Negative for palpitations and leg swelling.  Gastrointestinal: Negative for abdominal pain, nausea and vomiting.  Genitourinary: Negative for dysuria.  Musculoskeletal: Negative.   Skin: Negative.   Neurological: Positive for light-headedness and numbness. Negative for dizziness, tremors, seizures, syncope, speech difficulty, weakness and headaches.    Physical Exam Updated Vital Signs BP (!) 145/97   Pulse 76   Temp 98.7 F (37.1 C) (Oral)   Resp 13   Ht 6\' 1"  (1.854 m)   Wt 117 kg   SpO2 100%   BMI 34.04 kg/m   Physical Exam Vitals and nursing note reviewed.  HENT:     Head: Normocephalic and atraumatic.     Mouth/Throat:     Mouth: Mucous membranes are moist.     Pharynx: No oropharyngeal exudate or posterior oropharyngeal erythema.  Eyes:     General:        Right eye: No discharge.        Left eye: No discharge.     Extraocular Movements: Extraocular movements intact.     Conjunctiva/sclera: Conjunctivae normal.     Pupils: Pupils are equal, round, and reactive to light.  Neck:     Trachea: Trachea normal.  Cardiovascular:     Rate and Rhythm: Normal rate and regular rhythm.     Pulses:          Radial pulses are 2+ on the right side and 2+ on the left side.       Dorsalis pedis pulses are 1+ on the right side and 1+ on the left side.     Heart sounds: Normal heart sounds. No murmur heard.   Pulmonary:     Effort: Pulmonary effort is normal. No respiratory distress.     Breath sounds: Normal breath  sounds. No wheezing or rales.  Chest:     Chest wall: No deformity, swelling, tenderness, crepitus or edema.  Abdominal:     General: Bowel sounds are normal. There is no distension.     Palpations: Abdomen is soft.     Tenderness: There is no abdominal tenderness. There is no guarding or rebound.  Musculoskeletal:        General: No deformity.     Cervical back: Neck supple. No rigidity or crepitus. No pain with movement.     Right lower leg: No edema.     Left lower leg: No edema.  Lymphadenopathy:     Cervical: No cervical adenopathy.  Skin:    General: Skin is warm and dry.  Capillary Refill: Capillary refill takes less than 2 seconds.  Neurological:     General: No focal deficit present.     Mental Status: He is alert and oriented to person, place, and time. Mental status is at baseline.     Cranial Nerves: Cranial nerves are intact.     Sensory: Sensation is intact.     Motor: Motor function is intact.     Coordination: Coordination is intact.     Gait: Gait is intact.  Psychiatric:        Mood and Affect: Mood normal.     ED Results / Procedures / Treatments   Labs (all labs ordered are listed, but only abnormal results are displayed) Labs Reviewed  BASIC METABOLIC PANEL - Abnormal; Notable for the following components:      Result Value   Glucose, Bld 104 (*)    All other components within normal limits  CBC - Abnormal; Notable for the following components:   WBC 10.8 (*)    All other components within normal limits  TROPONIN I (HIGH SENSITIVITY)  TROPONIN I (HIGH SENSITIVITY)    EKG None  EKG: there are no previous tracings available for comparison, normal sinus rhythm, Nonspecific T-wave inversions in the lateral leads, no STEMI.  Radiology DG Chest 2 View  Result Date: 12/17/2019 CLINICAL DATA:  Chest pain EXAM: CHEST - 2 VIEW COMPARISON:  Prior chest x-ray 12/03/2019 FINDINGS: The lungs are clear and negative for focal airspace consolidation,  pulmonary edema or suspicious pulmonary nodule. No pleural effusion or pneumothorax. Cardiac and mediastinal contours are within normal limits. No acute fracture or lytic or blastic osseous lesions. The visualized upper abdominal bowel gas pattern is unremarkable. IMPRESSION: Negative chest x-ray. Electronically Signed   By: Malachy Moan M.D.   On: 12/17/2019 13:21    Procedures Procedures (including critical care time)  Medications Ordered in ED Medications - No data to display  ED Course  I have reviewed the triage vital signs and the nursing notes.  Pertinent labs & imaging results that were available during my care of the patient were reviewed by me and considered in my medical decision making (see chart for details).    MDM Rules/Calculators/A&P HEAR Score: 25                       44 year old male with turn for left chest pain since 5 o'clock this morning, radiates into his left shoulder.  Differential diagnosis for this patient symptoms include but not limited to ACS, PE, dissection, pneumonia, pleural effusion, pneumothorax, pericarditis, arrhythmia, valvular disease, esophageal spasm, GERD, pancreatitis.   Patient hypertensive on intake to 141/90.  Vital signs otherwise normal. He is not tachypneic or tachycardic, O2 sat 99% on room air.  Physical exam very reassuring.  Cardiopulmonary exam normal.  Patient is neurologically intact.  CBC, BMP, Troponin, chest x-ray, EKG ordered in triage.  EKG with nonspecific T wave inversions lateral leads, no STEMI, sinus rhythm.  Chest x-ray negative for acute cardiopulmonary disease.  CBC mild leukocytosis of 10.8.  BMP unremarkable.  Troponin initially 5, negative.  Delta troponin pending.  PERC negative, HEART score of 3.  Delta troponin negative, 5.  Case discussed with attending, Dr. Clarene Duke.  Given reassuring vital signs, physical exam, laboratory studies, chest x-ray, and nonacute EKG, I do not feel any further work-up  is necessary in the emergency department at this time.  At time of reevaluation of patient he is resting comfortably in  his hospital bed, states his pain is improved.  He was able to ambulate the patient in the room, he is able to ambulate independently without difficulty.  Instructed patient that he needs to keep his cardiology appointment for next week.   Yang voiced understanding of his medical evaluation and treatment plan.  Each of his questions were answered to his expressed satisfaction.  He states he is feeling much better given reassurance that he is not currently having heart attack, and after receiving education provided today while in the emergency department.  Strict return precautions were given.  Patient is stable for discharge.  Final Clinical Impression(s) / ED Diagnoses Final diagnoses:  Atypical chest pain    Rx / DC Orders ED Discharge Orders    None       Paris Lore, PA-C 12/17/19 1620    Kayleena Eke, Eugene Gavia, PA-C 12/17/19 1620    Little, Ambrose Finland, MD 12/18/19 [phone]

## 2021-02-16 DIAGNOSIS — R3589 Other polyuria: Secondary | ICD-10-CM | POA: Diagnosis not present

## 2021-02-16 DIAGNOSIS — M25511 Pain in right shoulder: Secondary | ICD-10-CM | POA: Diagnosis not present

## 2021-02-16 DIAGNOSIS — H547 Unspecified visual loss: Secondary | ICD-10-CM | POA: Diagnosis not present

## 2021-02-22 DIAGNOSIS — R202 Paresthesia of skin: Secondary | ICD-10-CM | POA: Diagnosis not present

## 2021-02-22 DIAGNOSIS — R42 Dizziness and giddiness: Secondary | ICD-10-CM | POA: Diagnosis not present

## 2021-02-22 DIAGNOSIS — H539 Unspecified visual disturbance: Secondary | ICD-10-CM | POA: Diagnosis not present

## 2021-02-22 DIAGNOSIS — R7303 Prediabetes: Secondary | ICD-10-CM | POA: Diagnosis not present

## 2021-02-24 DIAGNOSIS — H43393 Other vitreous opacities, bilateral: Secondary | ICD-10-CM | POA: Diagnosis not present

## 2021-02-27 DIAGNOSIS — M25511 Pain in right shoulder: Secondary | ICD-10-CM | POA: Diagnosis not present

## 2021-03-04 DIAGNOSIS — M25511 Pain in right shoulder: Secondary | ICD-10-CM | POA: Diagnosis not present

## 2022-08-15 ENCOUNTER — Ambulatory Visit: Payer: Self-pay | Attending: Cardiovascular Disease | Admitting: Cardiovascular Disease

## 2022-08-16 ENCOUNTER — Encounter: Payer: Self-pay | Admitting: Cardiovascular Disease

## 2022-08-21 ENCOUNTER — Encounter: Payer: Self-pay | Admitting: General Practice

## 2022-11-21 ENCOUNTER — Encounter: Payer: Self-pay | Admitting: Cardiology

## 2022-11-21 ENCOUNTER — Ambulatory Visit: Payer: Managed Care, Other (non HMO) | Attending: Internal Medicine | Admitting: Cardiology

## 2022-11-21 ENCOUNTER — Ambulatory Visit (INDEPENDENT_AMBULATORY_CARE_PROVIDER_SITE_OTHER): Payer: Managed Care, Other (non HMO)

## 2022-11-21 VITALS — BP 136/78 | HR 64 | Ht 73.0 in | Wt 231.2 lb

## 2022-11-21 DIAGNOSIS — R072 Precordial pain: Secondary | ICD-10-CM | POA: Diagnosis not present

## 2022-11-21 DIAGNOSIS — R0789 Other chest pain: Secondary | ICD-10-CM | POA: Diagnosis not present

## 2022-11-21 DIAGNOSIS — R011 Cardiac murmur, unspecified: Secondary | ICD-10-CM | POA: Diagnosis not present

## 2022-11-21 DIAGNOSIS — R002 Palpitations: Secondary | ICD-10-CM

## 2022-11-21 MED ORDER — METOPROLOL TARTRATE 50 MG PO TABS: ORAL_TABLET | ORAL | 0 refills | Status: DC

## 2022-11-21 NOTE — Patient Instructions (Addendum)
Medication Instructions:  Your physician recommends that you continue on your current medications as directed. Please refer to the Current Medication list given to you today.  *If you need a refill on your cardiac medications before your next appointment, please call your pharmacy*  Lab Work: Your physician recommends that you have lab work today- BMET and Lipid  If you have labs (blood work) drawn today and your tests are completely normal, you will receive your results only by: MyChart Message (if you have MyChart) OR A paper copy in the mail If you have any lab test that is abnormal or we need to change your treatment, we will call you to review the results.  Testing/Procedures: Your physician has requested that you have an echocardiogram. Echocardiography is a painless test that uses sound waves to create images of your heart. It provides your doctor with information about the size and shape of your heart and how well your heart's chambers and valves are working. This procedure takes approximately one hour. There are no restrictions for this procedure. Please do NOT wear cologne, perfume, aftershave, or lotions (deodorant is allowed). Please arrive 15 minutes prior to your appointment time.  Your physician has recommended that you wear a zio monitor. Monitors are medical devices that record the heart's electrical activity. Doctors most often [place] these monitors to diagnose arrhythmias. Arrhythmias are problems with the speed or rhythm of the heartbeat. The monitor is a small, portable device. You can wear one while you do your normal daily activities. This is usually used to diagnose what is causing palpitations/syncope (passing out).  Your physician has requested that you have cardiac CT. Cardiac computed tomography (CT) is a painless test that uses an x-ray machine to take clear, detailed pictures of your heart. For further information please visit [email or web address] Please follow  instruction sheet as given.  Follow-Up: At [facility], you and your health needs are our priority.  As part of our continuing mission to provide you with exceptional heart care, we have created designated Provider Care Teams.  These Care Teams include your primary Cardiologist (physician) and Advanced Practice Providers (APPs -  Physician Assistants and Nurse Practitioners) who all work together to provide you with the care you need, when you need it.  We recommend signing up for the patient portal called "MyChart".  Sign up information is provided on this After Visit Summary.  MyChart is used to connect with patients for Virtual Visits (Telemedicine).  Patients are able to view lab/test results, encounter notes, upcoming appointments, etc.  Non-urgent messages can be sent to your provider as well.   To learn more about what you can do with MyChart, go to [email or web address].    Your next appointment:   3 month(s)  Provider:   Elder Negus, MD     Other Instructions   Your cardiac CT will be scheduled at one of the below locations:   [facility] [address] [place], [place] [address] [phone]   If scheduled at [facility], please arrive at the [facility] and Children's Entrance (Entrance C2) of [facility] 30 minutes prior to test start time. You can use the FREE valet parking offered at entrance C (encouraged to control the heart rate for the test)  Proceed to the [facility] Radiology Department (first floor) to check-in and test prep.  All radiology patients and guests should use entrance C2 at [facility], accessed from [facility], even though the  hospital's physical address listed is [address]    If scheduled at [facility] or [facility], please arrive 15 mins early for check-in and test prep.  There is spacious parking and easy access to  the radiology department from the [facility] Heart and Vascular entrance. Please enter here and check-in with the desk attendant.   Please follow these instructions carefully (unless otherwise directed):  An IV will be required for this test and Nitroglycerin will be given.  Hold all erectile dysfunction medications at least 3 days (72 hrs) prior to test. (Ie viagra, cialis, sildenafil, tadalafil, etc)   On the Night Before the Test: Be sure to Drink plenty of water. Do not consume any caffeinated/decaffeinated beverages or chocolate 12 hours prior to your test. Do not take any antihistamines 12 hours prior to your test.  On the Day of the Test: Drink plenty of water until 1 hour prior to the test. Do not eat any food 1 hour prior to test. You may take your regular medications prior to the test.  Take metoprolol (Lopressor) two hours prior to test. If you take Losartan/Hydrochlorothiazide please HOLD on the morning of the test.  After the Test: Drink plenty of water. After receiving IV contrast, you may experience a mild flushed feeling. This is normal. On occasion, you may experience a mild rash up to 24 hours after the test. This is not dangerous. If this occurs, you can take Benadryl 25 mg and increase your fluid intake. If you experience trouble breathing, this can be serious. If it is severe call 911 IMMEDIATELY. If it is mild, please call our office.  We will call to schedule your test 2-4 weeks out understanding that some insurance companies will need an authorization prior to the service being performed.   For more information and frequently asked questions, please visit our website : [email or web address]  For non-scheduling related questions, please contact the cardiac imaging nurse navigator should you have any questions/concerns: Cardiac Imaging Nurse Navigators Direct Office Dial: [phone]   For scheduling needs, including cancellations and rescheduling, please  call [place], [phone].   Diet & Lifestyle recommendations:  Physical activity recommendation (The Physical Activity Guidelines for Americans. JAMA 2018;Nov 12) At least 150-300 minutes a week of moderate-intensity, or 75-150 minutes a week of vigorous-intensity aerobic physical activity, or an equivalent combination of moderate- and vigorous-intensity aerobic activity. Adults should perform muscle-strengthening activities on 2 or more days a week. Older adults should do multicomponent physical activity that includes balance training as well as aerobic and muscle-strengthening activities. Benefits of increased physical activity include lower risk of mortality including cardiovascular mortality, lower risk of cardiovascular events and associated risk factors (hypertension and diabetes), and lower risk of many cancers (including bladder, breast, colon, endometrium, esophagus, kidney, lung, and stomach). Additional improvments have been seen in cognition, risk of dementia, anxiety and depression, improved bone health, lower risk of falls, and associated injuries.  Dietary recommendation The 2019 ACC/AHA guidelines promote nutrition as a main fixture of cardiovascular wellness, with a recommendation for a varied diet of fruit, vegetables, fish, legumes, and whole grains (Class I), as well as recommendations to reduce sodium, cholesterol, processed meats, and refined sugars (Class IIa recommendation).10 Sodium intake, a topic of some controversy as of late, is recommended to be kept at 1,500 mg/day or less, far below the average daily intake in the [place] of 3,409 mg/day, and notably below that of previous US recommendations for 300mg /day.10,11 For those  unable to reach 1,500 mg/day, they recommend at least a reduction of 1000 mg/day.  A Pesco-Mediterranean Diet With Intermittent Fasting: JACC Review Topic of the Week. J Am Coll Cardiol 2020;76:1484-1493 Pesco-Mediterranean diet, it is supplemented with  extra-virgin olive oil (EVOO), which is the principle fat source, along with moderate amounts of dairy (particularly yogurt and cheese) and eggs, as well as modest amounts of alcohol consumption (ideally red wine with the evening meal), but few red and processed meats.

## 2022-11-21 NOTE — Progress Notes (Unsigned)
Enrolled patient for a 14 day Zio XT  monitor to be mailed to patients home  °

## 2022-11-21 NOTE — Progress Notes (Signed)
Cardiology Office Note:  .   Date:  11/21/2022  ID:  Eduardo Schroeder, DOB 04/09/1975, MRN [id number] PCP: Darrow Bussing, MD  [facility] Providers Cardiologist:  Truett Mainland, MD PCP: Darrow Bussing, MD  Chief Complaint  Patient presents with   Chest Pain      History of Present Illness: .    Eduardo Schroeder is a 47 y.o. 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 He reports occasional chest pressure that can occur at rest and lasts for seconds. He also describes daily episodes of palpitations lasting for two to three hours. He denies exertional chest pain, shortness of breath, leg swelling, lightheadedness, and syncope.  The patient has a significant family history of early onset coronary artery disease, with his mother having undergone stent placement and open heart surgery around the patient's current age. He smokes cigars occasionally, mainly on weekends, and consume alcohol in moderation. He had his cholesterol checked approximately six months ago and were told it was normal. He is due for a repeat check in the near future.  Vitals:   11/21/22 1454  BP: 136/78  Pulse: 64  SpO2: 97%     ROS:  Review of Systems  Cardiovascular:  Positive for chest pain and palpitations. Negative for dyspnea on exertion, leg swelling and syncope.     Studies Reviewed: Marland Kitchen       EKG 11/21/2022: Normal sinus rhythm T wave abnormality, consider lateral ischemia No previous ECGs available     Labs 05/2022     Physical Exam:   Physical Exam Vitals and nursing note reviewed.  Constitutional:      General: He is not in acute distress. Neck:     Vascular: No JVD.  Cardiovascular:     Rate and Rhythm: Normal rate and regular rhythm.     Heart sounds: Murmur heard.      Harsh midsystolic murmur is present with a grade of 2/6 at the upper right sternal border radiating to the neck.  Pulmonary:     Effort: Pulmonary effort is normal.     Breath sounds: Normal breath sounds. No wheezing or rales.  Musculoskeletal:     Right lower leg: No edema.     Left lower leg: No edema.      VISIT DIAGNOSES:   ICD-10-CM   1. Chest pressure  R07.89 EKG 12-Lead    Lipid panel    Basic metabolic panel    CT CORONARY MORPH W/CTA COR W/SCORE W/CA W/CM &/OR WO/CM    LONG TERM MONITOR (3-14 DAYS)    ECHOCARDIOGRAM COMPLETE    2. Precordial pain  R07.2 Lipid panel    Basic metabolic panel    CT CORONARY MORPH W/CTA COR W/SCORE W/CA W/CM &/OR WO/CM    LONG TERM MONITOR (3-14 DAYS)    ECHOCARDIOGRAM COMPLETE    3. Murmur, cardiac  R01.1 Lipid panel    Basic metabolic panel    CT CORONARY MORPH W/CTA COR W/SCORE W/CA W/CM &/OR WO/CM    LONG TERM MONITOR (3-14 DAYS)    ECHOCARDIOGRAM COMPLETE    4. Palpitations  R00.2 Lipid panel    Basic metabolic panel    CT CORONARY MORPH W/CTA COR W/SCORE W/CA W/CM &/OR WO/CM    LONG TERM MONITOR (3-14 DAYS)    ECHOCARDIOGRAM COMPLETE  ASSESSMENT AND PLAN: .    Eduardo Schroeder is a 47 y.o. male with hypertension, chest pressure, palpitations  Chest pressure: Likely nonanginal.  However, patient has strong family history of early CAD.  Recommend coronary CT angiogram.  Palpitations: Recommend 2 week ZIO monitor.  Murmur: Suspicious for aortic stenosis.  In a young individual, it is unusual to have aortic stenosis and this normal tricuspid.  Will check echocardiogram.  Hypertension: Controlled     Meds ordered this encounter  Medications   metoprolol tartrate (LOPRESSOR) 50 MG tablet    Sig: Take one tablet by mouth 2 hours prior to your Cardiac CT.    Dispense:  1 tablet    Refill:  0     F/u in 3 months  Signed, Elder Negus, MD

## 2022-11-22 ENCOUNTER — Other Ambulatory Visit: Payer: Self-pay

## 2022-11-22 DIAGNOSIS — E7800 Pure hypercholesterolemia, unspecified: Secondary | ICD-10-CM

## 2022-11-22 LAB — BASIC METABOLIC PANEL
BUN/Creatinine Ratio: 10 (ref 9–20)
BUN: 10 mg/dL (ref 6–24)
CO2: 21 mmol/L (ref 20–29)
Calcium: 10.2 mg/dL (ref 8.7–10.2)
Chloride: 101 mmol/L (ref 96–106)
Creatinine, Ser: 0.99 mg/dL (ref 0.76–1.27)
Glucose: 72 mg/dL (ref 70–99)
Potassium: 3.7 mmol/L (ref 3.5–5.2)
Sodium: 142 mmol/L (ref 134–144)
eGFR: 95 mL/min/{1.73_m2} (ref >59)

## 2022-11-22 LAB — LIPID PANEL
Chol/HDL Ratio: 5.4 {ratio} — ABNORMAL HIGH (ref 0.0–5.0)
Cholesterol, Total: 270 mg/dL — ABNORMAL HIGH (ref 100–199)
HDL: 50 mg/dL (ref >39)
LDL Chol Calc (NIH): 198 mg/dL — ABNORMAL HIGH (ref 0–99)
Triglycerides: 121 mg/dL (ref 0–149)
VLDL Cholesterol Cal: 22 mg/dL (ref 5–40)

## 2022-11-29 DIAGNOSIS — R002 Palpitations: Secondary | ICD-10-CM | POA: Diagnosis not present

## 2022-11-29 DIAGNOSIS — R072 Precordial pain: Secondary | ICD-10-CM

## 2022-11-29 DIAGNOSIS — R0789 Other chest pain: Secondary | ICD-10-CM | POA: Diagnosis not present

## 2022-11-29 DIAGNOSIS — R011 Cardiac murmur, unspecified: Secondary | ICD-10-CM

## 2022-12-02 NOTE — Progress Notes (Deleted)
Patient ID: Eduardo Schroeder                 DOB: January 11, 1976                    MRN: [id number]      HPI: Eduardo Schroeder is a 47 y.o. male patient referred to lipid clinic by Patwardhan. PMH is significant for hypertension, chest pressure, palpitations. The patient has a significant family history of early onset coronary artery disease, with his mother having undergone stent placement and open heart surgery around the patient's current age. He smokes cigars occasionally, mainly on weekends, and consume alcohol in moderation.  LDL-C 11/21/22 found to be 198 normal TG. It is planned for him to undergo coronary CTA.   Will have to do statin first- rosuvastatin 20mg  daily Then PCSK9i if its not enough Diet Exercise    Reviewed options for lowering LDL cholesterol, including ezetimibe, PCSK-9 inhibitors, bempedoic acid and inclisiran.  Discussed mechanisms of action, dosing, side effects and potential decreases in LDL cholesterol.  Also reviewed cost information and potential options for patient assistance.   Current Medications: none Intolerances:  Risk Factors: strong family hx, LDL-C >190 LDL-C goal: <70 (CTA may change goal) ApoB goal:   Diet:   Exercise:   Family History:   Social History:   Labs: Lipid Panel     Component Value Date/Time   CHOL 270 (H) 11/21/2022 1543   TRIG 121 11/21/2022 1543   HDL 50 11/21/2022 1543   CHOLHDL 5.4 (H) 11/21/2022 1543   LDLCALC 198 (H) 11/21/2022 1543   LABVLDL 22 11/21/2022 1543    Past Medical History:  Diagnosis Date   Acid reflux    Numbness and tingling in both hands    Prediabetes    Prostatitis    Vision problem     Current Outpatient Medications on File Prior to Visit  Medication Sig Dispense Refill   acetaminophen (TYLENOL) 325 MG tablet Take 650 mg by mouth every 6 (six) hours as needed for mild pain or moderate pain.     losartan-hydrochlorothiazide (HYZAAR) 50-12.5 MG tablet 1 tablet Orally Once a day for 90 days      metoprolol tartrate (LOPRESSOR) 50 MG tablet Take one tablet by mouth 2 hours prior to your Cardiac CT. 1 tablet 0   No current facility-administered medications on file prior to visit.    Allergies  Allergen Reactions   Vicodin [Hydrocodone-Acetaminophen] Hives and Swelling    Assessment/Plan:  1. Hyperlipidemia -  No problem-specific Assessment & Plan notes found for this encounter.    Thank you,  Olene Floss, Pharm.D, BCPS, CPP [facility] A Division of [facility] [address], [place], [place] [address]  Phone: [phone]; Fax: [phone]

## 2022-12-03 NOTE — Progress Notes (Unsigned)
Patient ID: Eduardo Schroeder                 DOB: 04/12/1975                    MRN: [id number]     HPI: Eduardo Schroeder is a 47 y.o. male patient referred to lipid clinic by Dr. Rosemary Holms. PMH is significant for HLD, HTN, chest pressure, palpitations.He has significant family history of early onset CAD (mother underwent PCI with stent placement and later open-heart surgery). At his last cardiology clinic visit, patient was being seen after referral for complaints of intermittent chest pain and palpitations  At this visit risk factor modification for primary event risk reduction was discussed, and orders for a lipid panel were put in at this time. After results showed patient's LDL-C >190 mg/dL. Patient was referred to our clinic for initiation of LDL-lowering therapy.  A1c 6.4% (2018)  Current Medications: none Intolerances: none Risk Factors: HLD, family history of  LDL goal: <70 mg/dL (LDL >956 + FH)  Diet:   Exercise:   Family History:  Mother-CAD diagnosed in her 31's (underwent PCI + CABG)  Social History:  Smokes cigars 1x/week  Labs:  Lipid Panel     Component Value Date/Time   CHOL 270 (H) 11/21/2022 1543   TRIG 121 11/21/2022 1543   HDL 50 11/21/2022 1543   CHOLHDL 5.4 (H) 11/21/2022 1543   LDLCALC 198 (H) 11/21/2022 1543   LABVLDL 22 11/21/2022 1543    Past Medical History:  Diagnosis Date   Acid reflux    Numbness and tingling in both hands    Prediabetes    Prostatitis    Vision problem     Current Outpatient Medications on File Prior to Visit  Medication Sig Dispense Refill   acetaminophen (TYLENOL) 325 MG tablet Take 650 mg by mouth every 6 (six) hours as needed for mild pain or moderate pain.     losartan-hydrochlorothiazide (HYZAAR) 50-12.5 MG tablet 1 tablet Orally Once a day for 90 days     metoprolol tartrate (LOPRESSOR) 50 MG tablet Take one tablet by mouth 2 hours prior to your Cardiac CT. 1 tablet 0   No current facility-administered  medications on file prior to visit.    Allergies  Allergen Reactions   Vicodin [Hydrocodone-Acetaminophen] Hives and Swelling    Assessment/Plan:  1. Hyperlipidemia -  Reviewed options for lowering LDL cholesterol, including statins, ezetimibe, PCSK9 inhibitors, Nexletol/Nexlizet, and Leqvio. Discussed efficacy, dosing, side effects, and copay information.

## 2022-12-04 ENCOUNTER — Ambulatory Visit: Payer: Managed Care, Other (non HMO)

## 2022-12-05 ENCOUNTER — Ambulatory Visit: Payer: Self-pay | Admitting: Cardiology

## 2022-12-06 ENCOUNTER — Other Ambulatory Visit (HOSPITAL_BASED_OUTPATIENT_CLINIC_OR_DEPARTMENT_OTHER): Payer: Self-pay | Admitting: Family Medicine

## 2022-12-06 DIAGNOSIS — R200 Anesthesia of skin: Secondary | ICD-10-CM

## 2022-12-07 ENCOUNTER — Ambulatory Visit (HOSPITAL_COMMUNITY)
Admission: RE | Admit: 2022-12-07 | Discharge: 2022-12-07 | Disposition: A | Discharge status: Home / Self Care | Payer: Commercial Managed Care - HMO | Source: Ambulatory Visit | Attending: Family Medicine | Admitting: Family Medicine

## 2022-12-07 DIAGNOSIS — R200 Anesthesia of skin: Secondary | ICD-10-CM | POA: Diagnosis present

## 2022-12-21 ENCOUNTER — Ambulatory Visit: Payer: Self-pay | Admitting: Internal Medicine

## 2022-12-21 ENCOUNTER — Ambulatory Visit (HOSPITAL_COMMUNITY): Payer: Commercial Managed Care - HMO | Attending: Cardiology

## 2022-12-21 DIAGNOSIS — R011 Cardiac murmur, unspecified: Secondary | ICD-10-CM

## 2022-12-21 DIAGNOSIS — R0789 Other chest pain: Secondary | ICD-10-CM | POA: Insufficient documentation

## 2022-12-21 DIAGNOSIS — R072 Precordial pain: Secondary | ICD-10-CM | POA: Diagnosis present

## 2022-12-21 DIAGNOSIS — R002 Palpitations: Secondary | ICD-10-CM | POA: Insufficient documentation

## 2022-12-21 LAB — ECHOCARDIOGRAM COMPLETE
AR max vel: 2.3 cm2
AV Area VTI: 2.47 cm2
AV Area mean vel: 2.26 cm2
AV Mean grad: 10.8 mm[Hg]
AV Peak grad: 19.9 mm[Hg]
Ao pk vel: 2.23 m/s
Area-P 1/2: 3.46 cm2
P 1/2 time: 685 ms
S' Lateral: 2.3 cm

## 2023-03-01 ENCOUNTER — Ambulatory Visit: Payer: Commercial Managed Care - HMO | Attending: Cardiology | Admitting: Cardiology

## 2023-03-01 NOTE — Progress Notes (Deleted)
  Cardiology Office Note:  .   Date:  03/01/2023  ID:  Eduardo Schroeder, DOB 1976-01-24, MRN [id number] PCP: Darrow Bussing, MD  [facility] Providers Cardiologist:  Truett Mainland, MD PCP: Darrow Bussing, MD  No chief complaint on file.     History of Present Illness: .    Eduardo Schroeder is a 48 y.o. male with  hypertension, chest pressure, palpitations   There were no vitals filed for this visit.   ROS: *** ROS   Studies Reviewed: .        *** Independently interpreted ***/202***: Chol ***, TG ***, HDL ***, LDL *** HbA1C ***% Hb *** Cr *** ***  Risk Assessment/Calculations:   {Does this patient have ATRIAL FIBRILLATION?:[phone]}     Physical Exam:   Physical Exam   VISIT DIAGNOSES: No diagnosis found.   ASSESSMENT AND PLAN: .    Eduardo Schroeder is a 48 y.o. male with hypertension, chest pressure, palpitations   *** Chest pressure: Likely nonanginal.  However, patient has strong family history of early CAD.  Recommend coronary CT angiogram.   Palpitations: Recommend 2 week ZIO monitor.   Murmur: Suspicious for aortic stenosis.  In a young individual, it is unusual to have aortic stenosis and this normal tricuspid.  Will check echocardiogram.   Hypertension: Controlled    {Are you ordering a CV Procedure (e.g. stress test, cath, DCCV, TEE, etc)?   Press F2        :[id number]}    No orders of the defined types were placed in this encounter.    F/u in ***  Signed, Elder Negus, MD

## 2023-11-21 ENCOUNTER — Telehealth: Payer: Self-pay | Admitting: Cardiology

## 2023-11-21 NOTE — Telephone Encounter (Signed)
 Spoke with patient, he advised that he has been having chest pain that has been coming and going over the last week. Pt denies any associated symptoms. Pt admits that chest pain has been occurring when resting and nothing has been triggering chest pain. Pt admits that chest pain is sharp and last 30 seconds to a minute. DOD appt made with Dr. Floretta on 11/21/23. ED precautions advised.

## 2023-11-21 NOTE — Progress Notes (Signed)
 Cardiology Office Note:   Date:  11/21/2023  ID:  Eduardo Schroeder, DOB 1975/12/12, MRN [id number] PCP: Regino Slater, MD  [facility] Providers Cardiologist:  Newman JINNY Lawrence, MD { Chief Complaint: No chief complaint on file.     History of Present Illness:   Eduardo Schroeder is a 48 y.o. male with a PMH of HLD, prediabetes, and obesity who presents for follow up ***.   Past Medical History:  Diagnosis Date   Acid reflux    Numbness and tingling in both hands    Prediabetes    Prostatitis    Vision problem      Studies Reviewed:    EKG: ***       Cardiac Studies & Procedures   ______________________________________________________________________________________________     ECHOCARDIOGRAM  ECHOCARDIOGRAM COMPLETE 12/21/2022  Narrative ECHOCARDIOGRAM REPORT    Patient Name:   Eduardo Schroeder Date of Exam: 12/21/2022 Medical Rec #:  [id number]       Height:       73.0 in Accession #:    [id number]      Weight:       231.2 lb Date of Birth:  1976-01-14       BSA:          2.288 m Patient Age:    47 years        BP:           136/78 mmHg Patient Gender: M               HR:           67 bpm. Exam Location:  [address]  Procedure: 2D Echo, 3D Echo, Cardiac Doppler and Color Doppler  Indications:    R01.1 Murmur  History:        Patient has no prior history of Echocardiogram examinations. Signs/Symptoms:Murmur and Chest Pain; Risk Factors:Hypertension, Dyslipidemia, Family History of Coronary Artery Disease and Current Smoker. Recent Facial Numbness with Lightheadedness and Blurry Vision (symptoms still perstistant, however better), Palpitations.  Sonographer:    Heather Hawks RDCS Referring Phys: NEWMAN JINNY PATWARDHAN  IMPRESSIONS   1. Left ventricular ejection fraction, by estimation, is 55 to 60%. Left ventricular ejection fraction by 3D volume is 57 %. The left ventricle has normal function. The left ventricle has no regional wall  motion abnormalities. Left ventricular diastolic parameters were normal. 2. Right ventricular systolic function is normal. The right ventricular size is mildly enlarged. 3. The mitral valve is normal in structure. No evidence of mitral valve regurgitation. 4. Calcified nodule between the right and noncoronary cusps. The aortic valve is calcified. Aortic valve regurgitation is mild. Aortic valve sclerosis/calcification is present, without any evidence of aortic stenosis. 5. The inferior vena cava is normal in size with greater than 50% respiratory variability, suggesting right atrial pressure of 3 mmHg.  FINDINGS Left Ventricle: Left ventricular ejection fraction, by estimation, is 55 to 60%. Left ventricular ejection fraction by 3D volume is 57 %. The left ventricle has normal function. The left ventricle has no regional wall motion abnormalities. The left ventricular internal cavity size was normal in size. There is no left ventricular hypertrophy. Left ventricular diastolic parameters were normal.  Right Ventricle: The right ventricular size is mildly enlarged. No increase in right ventricular wall thickness. Right ventricular systolic function is normal.  Left Atrium: Left atrial size was normal in size.  Right Atrium: Right atrial size was normal in size.  Pericardium: There is no evidence of  pericardial effusion.  Mitral Valve: The mitral valve is normal in structure. No evidence of mitral valve regurgitation.  Tricuspid Valve: The tricuspid valve is normal in structure. Tricuspid valve regurgitation is trivial.  Aortic Valve: Calcified nodule between the right and noncoronary cusps. The aortic valve is calcified. Aortic valve regurgitation is mild. Aortic regurgitation PHT measures 685 msec. Aortic valve sclerosis/calcification is present, without any evidence of aortic stenosis. Aortic valve mean gradient measures 10.8 mmHg. Aortic valve peak gradient measures 19.9 mmHg. Aortic valve  area, by VTI measures 2.47 cm.  Pulmonic Valve: The pulmonic valve was normal in structure. Pulmonic valve regurgitation is not visualized.  Aorta: The aortic root and ascending aorta are structurally normal, with no evidence of dilitation.  Venous: The inferior vena cava is normal in size with greater than 50% respiratory variability, suggesting right atrial pressure of 3 mmHg.  IAS/Shunts: No atrial level shunt detected by color flow Doppler.   LEFT VENTRICLE PLAX 2D LVIDd:         4.30 cm         Diastology LVIDs:         2.30 cm         LV e' medial:    8.05 cm/s LV PW:         1.00 cm         LV E/e' medial:  7.8 LV IVS:        0.90 cm         LV e' lateral:   13.60 cm/s LVOT diam:     2.30 cm         LV E/e' lateral: 4.6 LV SV:         108 LV SV Index:   47 LVOT Area:     4.15 cm        3D Volume EF LV 3D EF:    Left ventricul ar ejection fraction by 3D volume is 57 %.  3D Volume EF: 3D EF:        57 % LV EDV:       121 ml LV ESV:       52 ml LV SV:        69 ml  RIGHT VENTRICLE RV Basal diam:  4.10 cm RV Mid diam:    3.00 cm RV S prime:     13.80 cm/s TAPSE (M-mode): 2.4 cm  LEFT ATRIUM             Index        RIGHT ATRIUM           Index LA diam:        4.00 cm 1.75 cm/m   RA Pressure: 3.00 mmHg LA Vol (A2C):   51.3 ml 22.42 ml/m  RA Area:     13.00 cm LA Vol (A4C):   60.1 ml 26.26 ml/m  RA Volume:   35.10 ml  15.34 ml/m LA Biplane Vol: 55.7 ml 24.34 ml/m AORTIC VALVE AV Area (Vmax):    2.30 cm AV Area (Vmean):   2.26 cm AV Area (VTI):     2.47 cm AV Vmax:           223.25 cm/s AV Vmean:          150.250 cm/s AV VTI:            0.435 m AV Peak Grad:      19.9 mmHg AV Mean Grad:      10.8 mmHg LVOT  Vmax:         123.33 cm/s LVOT Vmean:        81.867 cm/s LVOT VTI:          0.259 m LVOT/AV VTI ratio: 0.60 AI PHT:            685 msec  AORTA Ao Root diam: 3.20 cm Ao Asc diam:  3.50 cm  MITRAL VALVE               TRICUSPID VALVE MV  Area (PHT): cm         Estimated RAP:  3.00 mmHg MV Decel Time: 219 msec MV E velocity: 62.90 cm/s  SHUNTS MV A velocity: 73.40 cm/s  Systemic VTI:  0.26 m MV E/A ratio:  0.86        Systemic Diam: 2.30 cm  Morene Brownie Electronically signed by Morene Brownie Signature Date/Time: 12/21/2022/11:49:40 AM    Final    MONITORS  LONG TERM MONITOR (3-14 DAYS) 12/19/2022  Narrative Zio patch monitor 13 days 11/29/2022 - 12/13/2022: Dominant rhythm: Sinus. HR 44-131 bpm. Avg HR 70 bpm. 1 episode of atrial tachycardia, at 116 bpm for 8 beats. <1% isolated SVE, couplets. 1 episode of VT at 120 bpm for 4 beats. <1% isolated VE, couplets. No atrial fibrillation/atrial flutter/high grade AV block, sinus pause >3sec noted. 2 patient triggered events, correlated with sinus rhythm.       ______________________________________________________________________________________________      Risk Assessment/Calculations:   {Does this patient have ATRIAL FIBRILLATION?[phone]} No BP recorded.  {Refresh Note OR Click here to enter BP  :1}***        Physical Exam:     VS:  There were no vitals taken for this visit. ***    Wt Readings from Last 3 Encounters:  11/21/22 231 lb 3.2 oz (104.9 kg)  12/17/19 258 lb (117 kg)  02/04/15 240 lb (108.9 kg)     GEN: Well nourished, well developed, in no acute distress NECK: No JVD; No carotid bruits CARDIAC: ***RRR, no murmurs, rubs, gallops RESPIRATORY:  Clear to auscultation without rales, wheezing or rhonchi  ABDOMEN: Soft, non-tender, non-distended, normal bowel sounds EXTREMITIES:  Warm and well perfused, no edema; No deformity, 2+ radial pulses PSYCH: Normal mood and affect   Assessment & Plan   -LHC  - Likely has FH*** - Lipid panel -LPA -Start Crestor 20 mg daily    {Are you ordering a CV Procedure (e.g. stress test, cath, DCCV, TEE, etc)?   Press F2        :[id number]}   This note was written with the assistance of a  dictation microphone or AI dictation software. Please excuse any typos or grammatical errors.   Signed, Georganna Archer, MD 11/21/2023 1:45 PM    [facility]

## 2023-11-21 NOTE — Telephone Encounter (Signed)
 Agree.  Thanks MJP

## 2023-11-21 NOTE — Telephone Encounter (Signed)
  Per MyChart scheduling message:  Pt c/o of Chest Pain: STAT if active (IN THIS MOMENT) CP, including tightness, pressure, jaw pain, shoulder/upper arm/back pain, SOB, nausea, and vomiting.  1. Are you having CP right now (tightness, pressure, or discomfort)?   2. Are you experiencing any other symptoms (ex. SOB, nausea, vomiting, sweating)?   3. How long have you been experiencing CP?   4. Is your CP continuous or coming and going?   5. Have you taken Nitroglycerin?   6. If CP returns before callback, please consider calling 911. ?   Yes  No Week Coming and going No

## 2023-11-22 ENCOUNTER — Other Ambulatory Visit: Payer: Self-pay

## 2023-11-22 ENCOUNTER — Ambulatory Visit
Attending: Student in an Organized Health Care Education/Training Program | Admitting: Student in an Organized Health Care Education/Training Program

## 2023-11-22 VITALS — BP 122/72 | HR 72 | Ht 73.0 in | Wt 231.0 lb

## 2023-11-22 DIAGNOSIS — E78019 Familial hypercholesterolemia, unspecified: Secondary | ICD-10-CM | POA: Diagnosis not present

## 2023-11-22 DIAGNOSIS — R072 Precordial pain: Secondary | ICD-10-CM

## 2023-11-22 DIAGNOSIS — R011 Cardiac murmur, unspecified: Secondary | ICD-10-CM

## 2023-11-22 DIAGNOSIS — E7800 Pure hypercholesterolemia, unspecified: Secondary | ICD-10-CM

## 2023-11-22 MED ORDER — ROSUVASTATIN CALCIUM 20 MG PO TABS: 20.0000 mg | ORAL_TABLET | Freq: Every day | ORAL | 3 refills | Status: AC

## 2023-11-22 MED ORDER — METOPROLOL TARTRATE 100 MG PO TABS: 100.0000 mg | ORAL_TABLET | Freq: Once | ORAL | 0 refills | Status: AC

## 2023-11-22 MED ORDER — METOPROLOL TARTRATE 100 MG PO TABS: 100.0000 mg | ORAL_TABLET | Freq: Once | ORAL | 0 refills | Status: DC

## 2023-11-22 NOTE — Assessment & Plan Note (Signed)
 Patient was found to have a calcified nodule in his aortic valve on echo imaging with associated mild AI 1 year ago.  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  He would likely need a repeat echocardiogram in another year to for surveillance.

## 2023-11-22 NOTE — Patient Instructions (Addendum)
 Medication Instructions:  Your physician has recommended you make the following change in your medication:  1) START taking Crestor (rosuvastatin) 20 mg once daily  *If you need a refill on your cardiac medications before your next appointment, please call your pharmacy*  Lab Work: IN THREE MONTHS: Fasting lipids and LipoA  Testing/Procedures: Cardiac CT Your physician has requested that you have cardiac CT. Cardiac computed tomography (CT) is a painless test that uses an x-ray machine to take clear, detailed pictures of your heart. For further information please visit [email or web address] Please follow instructions below.   Follow-Up: At [facility], you and your health needs are our priority.  As part of our continuing mission to provide you with exceptional heart care, our providers are all part of one team.  This team includes your primary Cardiologist (physician) and Advanced Practice Providers or APPs (Physician Assistants and Nurse Practitioners) who all work together to provide you with the care you need, when you need it.  Your next appointment:   6 months  Provider:   Newman JINNY Lawrence, MD       Your cardiac CT will be scheduled at one of the below locations:   [facility] [address] [place], [place] [address] [phone] (Severe contrast allergies only)  OR   [facility] [address]  [place], [place] [address] [phone]   If scheduled at [facility], please arrive at the [facility] Entrance (Entrance C2) of [facility] 30 minutes prior to test start time. You can use the FREE valet parking offered at entrance C (encouraged to control the heart rate for the test)  Proceed to the [facility] Radiology Department (first floor) to check-in and test prep.  All radiology patients and guests should use entrance C2 at [facility], accessed from [facility],  even though the hospital's physical address listed is [address].  If scheduled at the Heart and Vascular Tower at [address], please enter the parking lot using the [address] entrance and use the FREE valet service at the patient drop-off area. Enter the building and check-in with registration on the main floor.   Please follow these instructions carefully (unless otherwise directed):  An IV will be required for this test and Nitroglycerin will be given.  Hold all erectile dysfunction medications at least 3 days (72 hrs) prior to test. (Ie viagra, cialis, sildenafil, tadalafil, etc)   On the Night Before the Test: Be sure to Drink plenty of water. Do not consume any caffeinated/decaffeinated beverages or chocolate 12 hours prior to your test. Do not take any antihistamines 12 hours prior to your test.  On the Day of the Test: Drink plenty of water until 1 hour prior to the test. Do not eat any food 1 hour prior to test. You may take your regular medications prior to the test.  Take metoprolol  (Lopressor ) two hours prior to test. If you take Furosemide/Hydrochlorothiazide/Spironolactone/Chlorthalidone, please HOLD on the morning of the test. Patients who wear a continuous glucose monitor MUST remove the device prior to scanning.  After the Test: Drink plenty of water. After receiving IV contrast, you may experience a mild flushed feeling. This is normal. On occasion, you may experience a mild rash up to 24 hours after the test. This is not dangerous. If this occurs, you can take Benadryl 25 mg, Zyrtec, Claritin, or Allegra and increase your fluid intake. (Patients taking  Tikosyn should avoid Benadryl, and may take Zyrtec, Claritin, or Allegra) If you experience trouble breathing, this can be serious. If it is severe call 911 IMMEDIATELY. If it is mild, please call our office.  We will call to schedule your test 2-4 weeks out understanding that some insurance  companies will need an authorization prior to the service being performed.   For more information and frequently asked questions, please visit our website : [email or web address]  For non-scheduling related questions, please contact the cardiac imaging nurse navigator should you have any questions/concerns: Cardiac Imaging Nurse Navigators Direct Office Dial: [phone]   For scheduling needs, including cancellations and rescheduling, please call [place], [phone].

## 2023-11-22 NOTE — Assessment & Plan Note (Signed)
 The patient had a lipid panel 1 year ago showing an LDL of 198.  This degree of LDL elevation is most likely genetically mediated particularly given his family history of early CAD.  No physical exam findings consistent with FH, however, there is a decent chance that he has it.  I will start the patient on lipid-lowering therapy now and we will check a lipid panel and lipoprotein a in 3 months. -Start Crestor 20 mg daily -Lipid panel, lipoprotein a in 3 months

## 2023-12-05 ENCOUNTER — Encounter (HOSPITAL_COMMUNITY): Payer: Self-pay

## 2023-12-07 ENCOUNTER — Ambulatory Visit (HOSPITAL_COMMUNITY): Admission: RE | Admit: 2023-12-07

## 2023-12-11 ENCOUNTER — Encounter (HOSPITAL_COMMUNITY): Payer: Self-pay

## 2023-12-13 ENCOUNTER — Ambulatory Visit (HOSPITAL_COMMUNITY): Admission: RE | Admit: 2023-12-13 | Source: Ambulatory Visit

## 2024-01-22 ENCOUNTER — Encounter (HOSPITAL_COMMUNITY): Payer: Self-pay

## 2024-03-14 ENCOUNTER — Encounter: Payer: Self-pay | Admitting: Student in an Organized Health Care Education/Training Program

## 2024-03-14 NOTE — Telephone Encounter (Signed)
 Called pt advised of MD response.  Pt reports also has a pinched nerve and thinks symptoms could be r/t this.   Pt doesn't have any recent BP readings/ no BP cuff.   Again advised of MD response.  Pt reluctant to go to ED.
